# Patient Record
Sex: Female | Born: 1935 | Race: Black or African American | Hispanic: No | Smoking: Never smoker
Health system: Southern US, Community
[De-identification: ages and names within clinical notes are randomized; demographics above are authoritative.]

## PROBLEM LIST (undated history)

## (undated) DIAGNOSIS — K922 Gastrointestinal hemorrhage, unspecified: Secondary | ICD-10-CM

## (undated) DIAGNOSIS — E785 Hyperlipidemia, unspecified: Secondary | ICD-10-CM

## (undated) DIAGNOSIS — F0390 Unspecified dementia without behavioral disturbance: Secondary | ICD-10-CM

## (undated) DIAGNOSIS — R32 Unspecified urinary incontinence: Secondary | ICD-10-CM

## (undated) DIAGNOSIS — M549 Dorsalgia, unspecified: Secondary | ICD-10-CM

## (undated) DIAGNOSIS — H269 Unspecified cataract: Secondary | ICD-10-CM

## (undated) HISTORY — DX: Gastrointestinal hemorrhage, unspecified: K92.2

## (undated) HISTORY — PX: CATARACT EXTRACTION W/ INTRAOCULAR LENS  IMPLANT, BILATERAL: SHX1307

## (undated) HISTORY — DX: Dorsalgia, unspecified: M54.9

## (undated) HISTORY — DX: Unspecified cataract: H26.9

## (undated) HISTORY — PX: OTHER SURGICAL HISTORY: SHX169

## (undated) HISTORY — DX: Hyperlipidemia, unspecified: E78.5

## (undated) HISTORY — DX: Unspecified urinary incontinence: R32

---

## 2004-01-06 ENCOUNTER — Other Ambulatory Visit: Admission: RE | Admit: 2004-01-06 | Discharge: 2004-01-06 | Payer: Self-pay | Admitting: Family Medicine

## 2008-10-11 LAB — HM COLONOSCOPY

## 2008-10-11 LAB — HM DEXA SCAN

## 2009-02-21 ENCOUNTER — Encounter: Admission: RE | Admit: 2009-02-21 | Discharge: 2009-05-22 | Payer: Self-pay | Admitting: Family Medicine

## 2009-05-23 ENCOUNTER — Encounter: Admission: RE | Admit: 2009-05-23 | Discharge: 2009-08-10 | Payer: Self-pay | Admitting: Family Medicine

## 2010-06-20 ENCOUNTER — Ambulatory Visit (HOSPITAL_COMMUNITY): Admission: RE | Admit: 2010-06-20 | Discharge: 2010-06-20 | Payer: Self-pay | Admitting: Family Medicine

## 2010-06-20 LAB — HM MAMMOGRAPHY

## 2010-07-20 LAB — POC HEMOCCULT BLD/STL (HOME/3-CARD/SCREEN)

## 2010-08-23 LAB — HM PAP SMEAR

## 2010-12-02 ENCOUNTER — Encounter: Payer: Self-pay | Admitting: Family Medicine

## 2011-03-07 ENCOUNTER — Encounter: Payer: Self-pay | Admitting: Family Medicine

## 2011-03-07 DIAGNOSIS — R32 Unspecified urinary incontinence: Secondary | ICD-10-CM | POA: Insufficient documentation

## 2011-03-07 DIAGNOSIS — E785 Hyperlipidemia, unspecified: Secondary | ICD-10-CM | POA: Insufficient documentation

## 2011-03-07 DIAGNOSIS — H269 Unspecified cataract: Secondary | ICD-10-CM

## 2011-03-07 DIAGNOSIS — M549 Dorsalgia, unspecified: Secondary | ICD-10-CM | POA: Insufficient documentation

## 2011-03-07 DIAGNOSIS — K4090 Unilateral inguinal hernia, without obstruction or gangrene, not specified as recurrent: Secondary | ICD-10-CM | POA: Insufficient documentation

## 2011-03-07 DIAGNOSIS — K922 Gastrointestinal hemorrhage, unspecified: Secondary | ICD-10-CM

## 2011-05-06 ENCOUNTER — Other Ambulatory Visit: Payer: Self-pay | Admitting: Family Medicine

## 2011-05-06 DIAGNOSIS — M545 Low back pain: Secondary | ICD-10-CM

## 2011-05-08 ENCOUNTER — Ambulatory Visit
Admission: RE | Admit: 2011-05-08 | Discharge: 2011-05-08 | Disposition: A | Discharge status: Home / Self Care | Payer: Medicare Other | Source: Ambulatory Visit | Attending: Family Medicine | Admitting: Family Medicine

## 2011-05-08 DIAGNOSIS — M545 Low back pain: Secondary | ICD-10-CM

## 2011-12-11 DIAGNOSIS — T8529XA Other mechanical complication of intraocular lens, initial encounter: Secondary | ICD-10-CM | POA: Diagnosis not present

## 2012-02-03 DIAGNOSIS — R5381 Other malaise: Secondary | ICD-10-CM | POA: Diagnosis not present

## 2012-02-03 DIAGNOSIS — E785 Hyperlipidemia, unspecified: Secondary | ICD-10-CM | POA: Diagnosis not present

## 2012-02-03 DIAGNOSIS — R5383 Other fatigue: Secondary | ICD-10-CM | POA: Diagnosis not present

## 2012-02-03 DIAGNOSIS — E559 Vitamin D deficiency, unspecified: Secondary | ICD-10-CM | POA: Diagnosis not present

## 2012-02-27 DIAGNOSIS — M79609 Pain in unspecified limb: Secondary | ICD-10-CM | POA: Diagnosis not present

## 2012-02-27 DIAGNOSIS — B351 Tinea unguium: Secondary | ICD-10-CM | POA: Diagnosis not present

## 2012-02-28 DIAGNOSIS — W57XXXA Bitten or stung by nonvenomous insect and other nonvenomous arthropods, initial encounter: Secondary | ICD-10-CM | POA: Diagnosis not present

## 2012-02-28 DIAGNOSIS — L923 Foreign body granuloma of the skin and subcutaneous tissue: Secondary | ICD-10-CM | POA: Diagnosis not present

## 2012-03-10 DIAGNOSIS — R413 Other amnesia: Secondary | ICD-10-CM | POA: Diagnosis not present

## 2012-05-18 DIAGNOSIS — E785 Hyperlipidemia, unspecified: Secondary | ICD-10-CM | POA: Diagnosis not present

## 2012-05-18 DIAGNOSIS — R5383 Other fatigue: Secondary | ICD-10-CM | POA: Diagnosis not present

## 2012-05-18 DIAGNOSIS — R3919 Other difficulties with micturition: Secondary | ICD-10-CM | POA: Diagnosis not present

## 2012-05-18 DIAGNOSIS — K219 Gastro-esophageal reflux disease without esophagitis: Secondary | ICD-10-CM | POA: Diagnosis not present

## 2012-05-18 DIAGNOSIS — E559 Vitamin D deficiency, unspecified: Secondary | ICD-10-CM | POA: Diagnosis not present

## 2012-05-26 DIAGNOSIS — I739 Peripheral vascular disease, unspecified: Secondary | ICD-10-CM | POA: Diagnosis not present

## 2012-05-26 DIAGNOSIS — L851 Acquired keratosis [keratoderma] palmaris et plantaris: Secondary | ICD-10-CM | POA: Diagnosis not present

## 2012-08-25 DIAGNOSIS — L851 Acquired keratosis [keratoderma] palmaris et plantaris: Secondary | ICD-10-CM | POA: Diagnosis not present

## 2012-08-25 DIAGNOSIS — I70209 Unspecified atherosclerosis of native arteries of extremities, unspecified extremity: Secondary | ICD-10-CM | POA: Diagnosis not present

## 2012-08-25 DIAGNOSIS — L609 Nail disorder, unspecified: Secondary | ICD-10-CM | POA: Diagnosis not present

## 2012-08-26 DIAGNOSIS — K4090 Unilateral inguinal hernia, without obstruction or gangrene, not specified as recurrent: Secondary | ICD-10-CM | POA: Diagnosis not present

## 2012-08-26 DIAGNOSIS — E559 Vitamin D deficiency, unspecified: Secondary | ICD-10-CM | POA: Diagnosis not present

## 2012-08-26 DIAGNOSIS — E785 Hyperlipidemia, unspecified: Secondary | ICD-10-CM | POA: Diagnosis not present

## 2012-08-26 DIAGNOSIS — K922 Gastrointestinal hemorrhage, unspecified: Secondary | ICD-10-CM | POA: Diagnosis not present

## 2012-08-26 DIAGNOSIS — I10 Essential (primary) hypertension: Secondary | ICD-10-CM | POA: Diagnosis not present

## 2012-10-15 DIAGNOSIS — Z1231 Encounter for screening mammogram for malignant neoplasm of breast: Secondary | ICD-10-CM | POA: Diagnosis not present

## 2012-11-17 DIAGNOSIS — I70209 Unspecified atherosclerosis of native arteries of extremities, unspecified extremity: Secondary | ICD-10-CM | POA: Diagnosis not present

## 2012-11-17 DIAGNOSIS — L851 Acquired keratosis [keratoderma] palmaris et plantaris: Secondary | ICD-10-CM | POA: Diagnosis not present

## 2012-11-17 DIAGNOSIS — L609 Nail disorder, unspecified: Secondary | ICD-10-CM | POA: Diagnosis not present

## 2012-12-03 DIAGNOSIS — H52229 Regular astigmatism, unspecified eye: Secondary | ICD-10-CM | POA: Diagnosis not present

## 2012-12-03 DIAGNOSIS — Z961 Presence of intraocular lens: Secondary | ICD-10-CM | POA: Diagnosis not present

## 2012-12-03 DIAGNOSIS — H524 Presbyopia: Secondary | ICD-10-CM | POA: Diagnosis not present

## 2012-12-29 DIAGNOSIS — M25579 Pain in unspecified ankle and joints of unspecified foot: Secondary | ICD-10-CM | POA: Diagnosis not present

## 2012-12-29 DIAGNOSIS — I10 Essential (primary) hypertension: Secondary | ICD-10-CM | POA: Diagnosis not present

## 2012-12-29 DIAGNOSIS — K219 Gastro-esophageal reflux disease without esophagitis: Secondary | ICD-10-CM | POA: Diagnosis not present

## 2012-12-29 DIAGNOSIS — E559 Vitamin D deficiency, unspecified: Secondary | ICD-10-CM | POA: Diagnosis not present

## 2012-12-29 DIAGNOSIS — N318 Other neuromuscular dysfunction of bladder: Secondary | ICD-10-CM | POA: Diagnosis not present

## 2012-12-29 DIAGNOSIS — E785 Hyperlipidemia, unspecified: Secondary | ICD-10-CM | POA: Diagnosis not present

## 2013-01-06 DIAGNOSIS — K4090 Unilateral inguinal hernia, without obstruction or gangrene, not specified as recurrent: Secondary | ICD-10-CM | POA: Diagnosis not present

## 2013-02-17 ENCOUNTER — Ambulatory Visit: Payer: Self-pay | Admitting: Family Medicine

## 2013-03-23 DIAGNOSIS — R413 Other amnesia: Secondary | ICD-10-CM | POA: Diagnosis not present

## 2013-03-23 DIAGNOSIS — F0280 Dementia in other diseases classified elsewhere without behavioral disturbance: Secondary | ICD-10-CM | POA: Diagnosis not present

## 2013-03-31 ENCOUNTER — Ambulatory Visit: Payer: Medicare Other | Admitting: Family Medicine

## 2013-04-07 ENCOUNTER — Other Ambulatory Visit: Payer: Self-pay | Admitting: Family Medicine

## 2013-04-08 NOTE — Telephone Encounter (Signed)
RXs called into [facility] with 6 refills

## 2013-04-08 NOTE — Telephone Encounter (Signed)
Please advise patient calling back on this

## 2013-04-08 NOTE — Telephone Encounter (Signed)
It is okay to refill both of these prescriptions for 6 month

## 2013-04-15 IMAGING — CR DG ANKLE COMPLETE 3+V*R*
3 series · 3 of 3 positions shown · non-contrast
Comparison: None.

CLINICAL DATA: Medial ankle pain. No history of injury.

RIGHT ANKLE - COMPLETE 3+ VIEW

[view not recorded (1 of 3)]
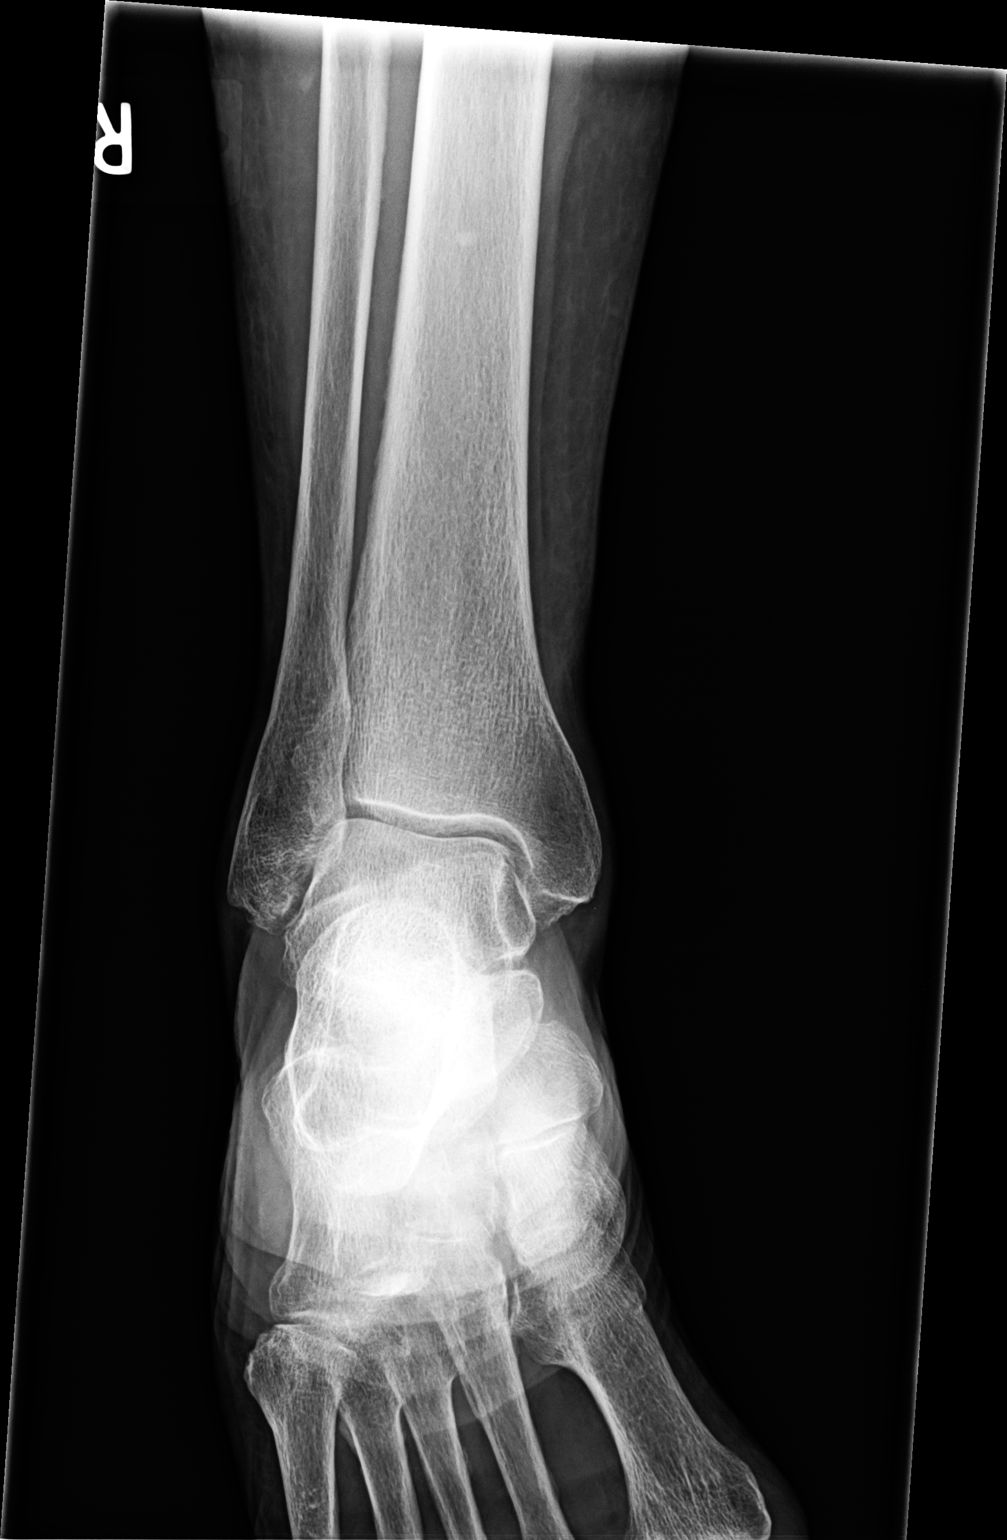

[view not recorded (2 of 3)]
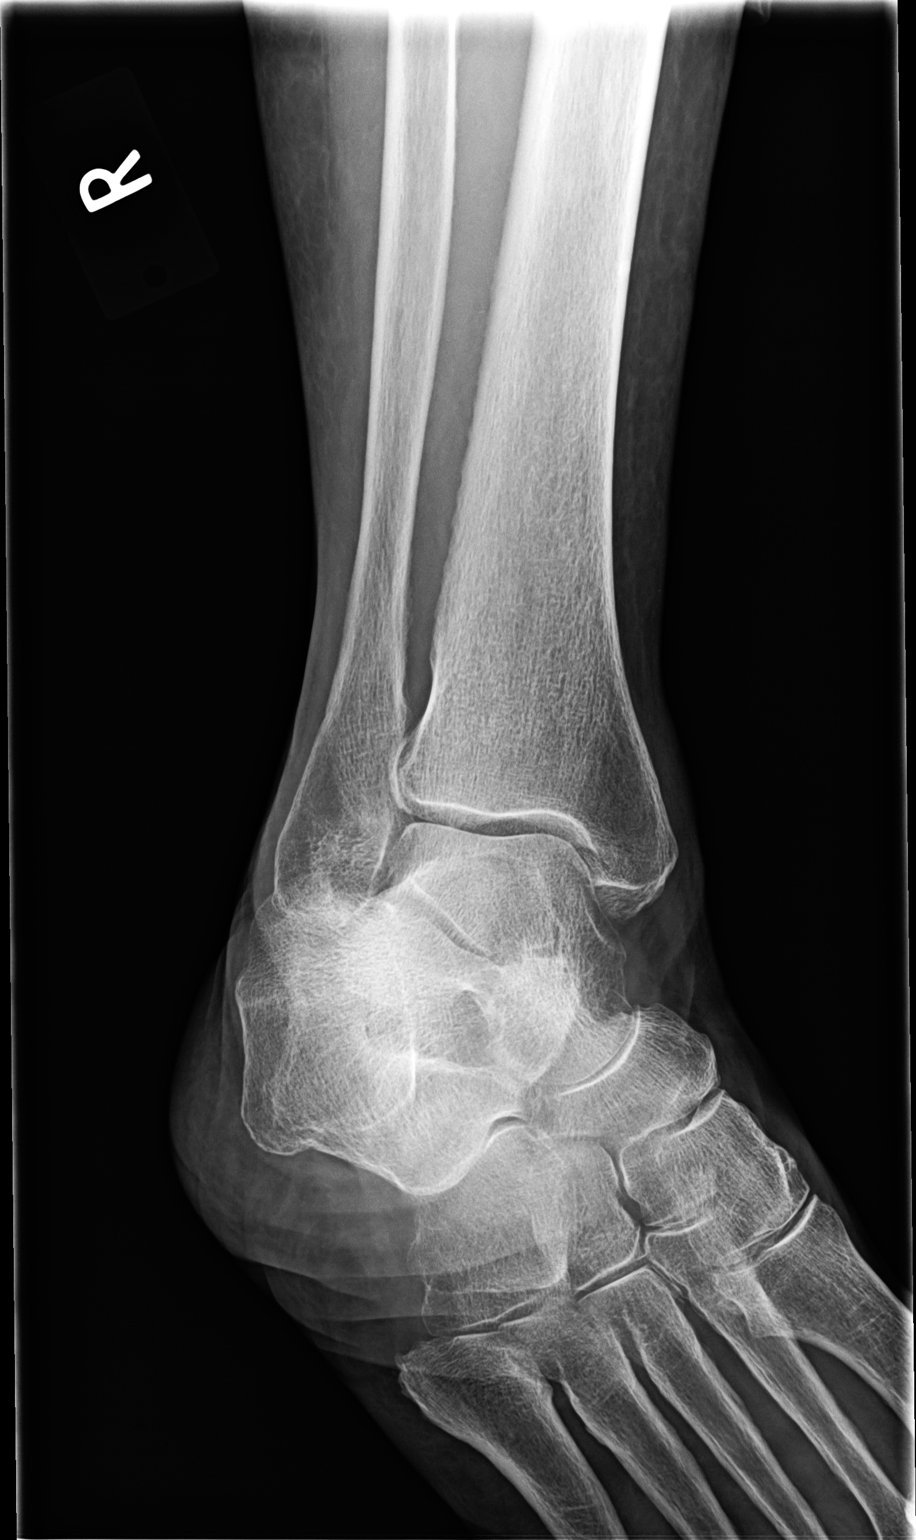

[view not recorded (3 of 3)]
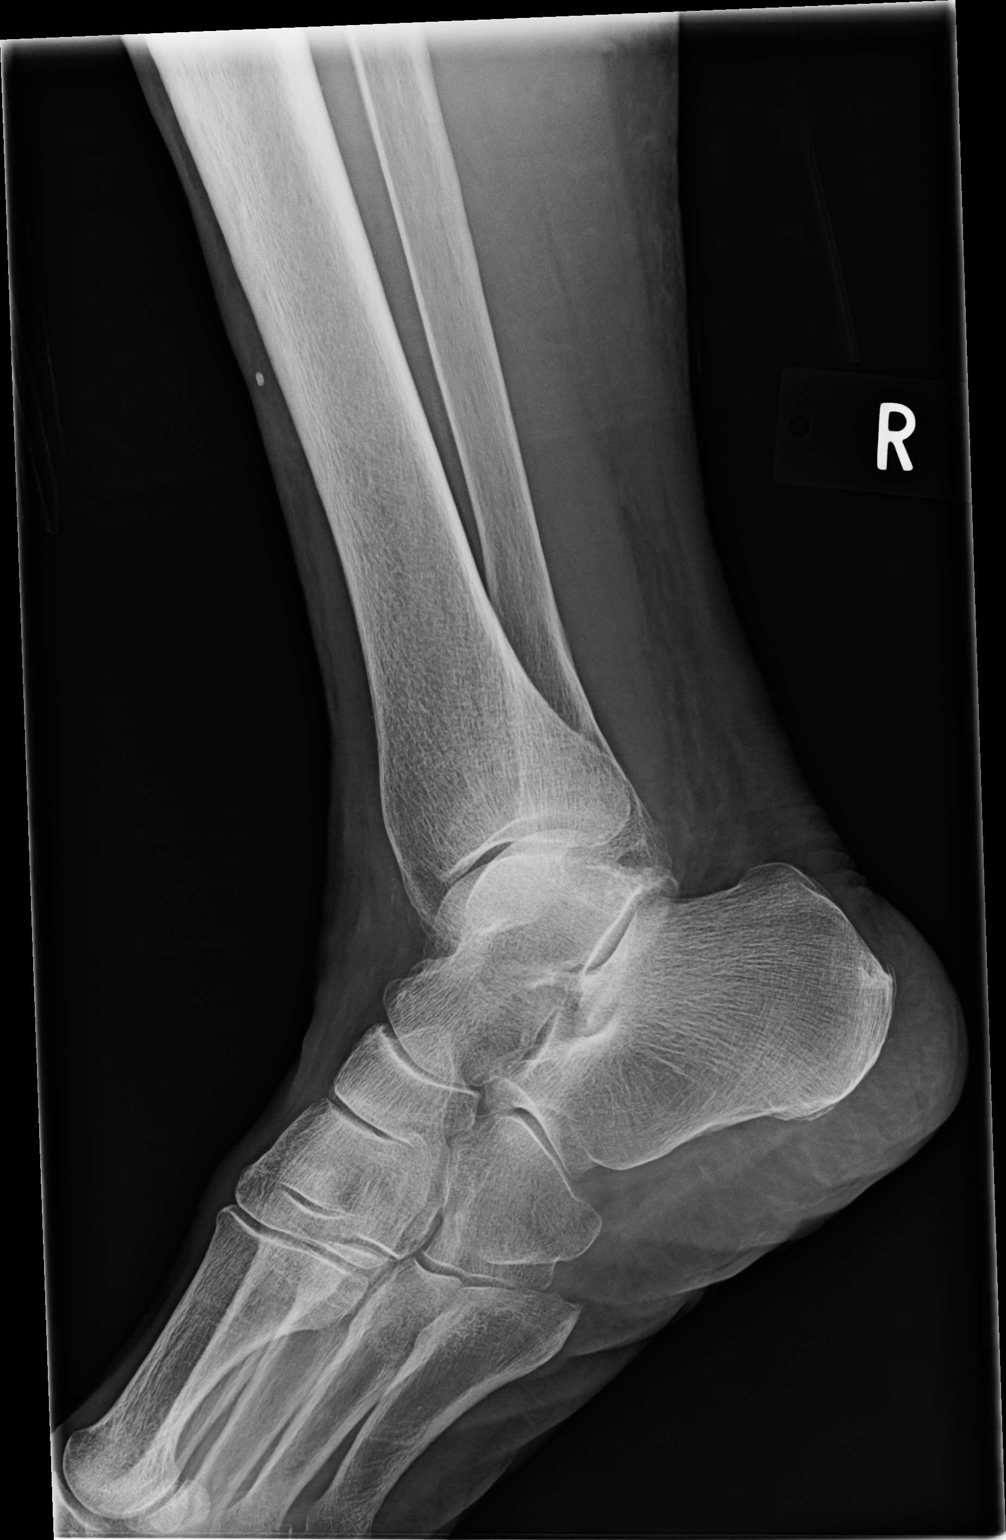

[3 of 3 positions shown; findings below may reference images not displayed]

FINDINGS: Imaged bones, joints and soft tissues appear normal.
IMPRESSION: Negative exam.

## 2013-05-13 ENCOUNTER — Other Ambulatory Visit: Payer: Self-pay | Admitting: Family Medicine

## 2013-07-02 ENCOUNTER — Other Ambulatory Visit: Payer: Self-pay | Admitting: Family Medicine

## 2013-07-02 NOTE — Telephone Encounter (Signed)
Last seen 12/29/12  DWM

## 2013-07-06 ENCOUNTER — Other Ambulatory Visit: Payer: Self-pay | Admitting: Family Medicine

## 2013-08-04 ENCOUNTER — Encounter: Payer: Self-pay | Admitting: Family Medicine

## 2013-08-04 ENCOUNTER — Ambulatory Visit (INDEPENDENT_AMBULATORY_CARE_PROVIDER_SITE_OTHER): Payer: Medicare Other | Admitting: Family Medicine

## 2013-08-04 ENCOUNTER — Ambulatory Visit (INDEPENDENT_AMBULATORY_CARE_PROVIDER_SITE_OTHER): Payer: Medicare Other

## 2013-08-04 VITALS — BP 132/75 | HR 62 | Temp 97.7°F | Ht 61.0 in | Wt 132.0 lb

## 2013-08-04 DIAGNOSIS — Z0000 Encounter for general adult medical examination without abnormal findings: Secondary | ICD-10-CM

## 2013-08-04 DIAGNOSIS — R350 Frequency of micturition: Secondary | ICD-10-CM | POA: Diagnosis not present

## 2013-08-04 DIAGNOSIS — E559 Vitamin D deficiency, unspecified: Secondary | ICD-10-CM | POA: Diagnosis not present

## 2013-08-04 DIAGNOSIS — K4090 Unilateral inguinal hernia, without obstruction or gangrene, not specified as recurrent: Secondary | ICD-10-CM

## 2013-08-04 DIAGNOSIS — E785 Hyperlipidemia, unspecified: Secondary | ICD-10-CM

## 2013-08-04 DIAGNOSIS — Z23 Encounter for immunization: Secondary | ICD-10-CM | POA: Diagnosis not present

## 2013-08-04 DIAGNOSIS — M549 Dorsalgia, unspecified: Secondary | ICD-10-CM

## 2013-08-04 LAB — POCT URINALYSIS DIPSTICK
Bilirubin, UA: NEGATIVE
Glucose, UA: NEGATIVE
Ketones, UA: NEGATIVE
Nitrite, UA: NEGATIVE
pH, UA: 7

## 2013-08-04 LAB — POCT UA - MICROSCOPIC ONLY
Bacteria, U Microscopic: NEGATIVE
Yeast, UA: NEGATIVE

## 2013-08-04 LAB — POCT CBC
MCV: 94 fL (ref 80–97)
MPV: 7.3 fL (ref 0–99.8)
POC Granulocyte: 2.4 (ref 2–6.9)
RBC: 4.9 M/uL (ref 4.04–5.48)
RDW, POC: 14.2 %

## 2013-08-04 NOTE — Progress Notes (Signed)
Subjective:    Patient ID: Courtney Frank, female    DOB: 11/24/1914, 79 y.o.   MRN: [id number]  HPIpt here today for annual physical exam and follow up and management of chronic medical problems. He is concerned that her inguinal hernia maybe getting bigger.: Health maintenance parameters she will be given FOBT today. She'll get a chest x-ray EKG flu shot and Prevnar today. Labs were also be drawn today and she will be scheduled for a mammogram in January of 15.    Patient Active Problem List   Diagnosis Date Noted  . GI bleed   . Other and unspecified hyperlipidemia   . Incontinence   . Back pain   . Cataracts, bilateral   . Inguinal hernia    Outpatient Encounter Prescriptions as of 08/04/2013  Medication Sig Dispense Refill  . acetaminophen (TYLENOL) 325 MG tablet Take 650 mg by mouth every 6 (six) hours as needed.        . calcium carbonate (OS-CAL) 600 MG TABS Take 600 mg by mouth 2 (two) times daily with a meal.        . Cholecalciferol (VITAMIN D3) 1000 UNITS CAPS Take 1 capsule by mouth daily.        Marland Kitchen EVISTA 60 MG tablet TAKE ONE TABLET BY MOUTH EVERY DAY  30 tablet  5  . lidocaine (LIDODERM) 5 % Place 1 patch onto the skin daily. Remove & Discard patch within 12 hours or as directed by MD       . omeprazole (PRILOSEC) 40 MG capsule TAKE ONE CAPSULE BY MOUTH IN THE MORNING ONE  HOUR  BEFORE  MEAL  30 capsule  0  . VYTORIN 10-80 MG per tablet TAKE ONE TABLET BY MOUTH EVERY DAY  30 tablet  5   No facility-administered encounter medications on file as of 08/04/2013.       Review of Systems  Constitutional: Negative.   HENT: Negative.   Eyes: Negative.   Respiratory: Negative.   Cardiovascular: Negative.   Gastrointestinal: Positive for abdominal pain (low abdominal aches).  Endocrine: Negative.   Genitourinary: Positive for frequency (worse at night).  Musculoskeletal: Positive for back pain (low back aches).  Skin: Negative.   Allergic/Immunologic: Negative.    Neurological: Positive for dizziness.  Hematological: Negative.   Psychiatric/Behavioral: Negative.        Objective:   Physical Exam  Nursing note and vitals reviewed. Constitutional: She is oriented to person, place, and time. She appears well-developed and well-nourished. No distress.   For her age of 73  HENT:  Head: Normocephalic and atraumatic.  Right Ear: External ear normal.  Left Ear: External ear normal.  Nose: Nose normal.  Mouth/Throat: No oropharyngeal exudate.  Eyes: Conjunctivae and EOM are normal. Pupils are equal, round, and reactive to light. Right eye exhibits no discharge. Left eye exhibits no discharge. No scleral icterus.  Neck: Normal range of motion. Neck supple. No thyromegaly present.  Cardiovascular: Normal rate, regular rhythm, normal heart sounds and intact distal pulses.  Exam reveals no gallop and no friction rub.   No murmur heard. 72 per minute Intact femoral pulses  Pulmonary/Chest: Effort normal and breath sounds normal. No respiratory distress. She has no wheezes. She has no rales. She exhibits no tenderness.  Abdominal: Soft. Bowel sounds are normal. She exhibits no mass. There is no tenderness. There is no rebound.  Musculoskeletal: Normal range of motion. She exhibits no edema and no tenderness.  Lymphadenopathy:    She has  no cervical adenopathy.  Neurological: She is alert and oriented to person, place, and time. She has normal reflexes. No cranial nerve deficit.  Patient is concerned about her memory but in my opinion her memory is excellent she is understanding of all components of the conversation with her and questions medical he relevant things that are completely in touch with whom she is  Skin: Skin is warm and dry.  Psychiatric: She has a normal mood and affect. Her behavior is normal. Judgment and thought content normal.    BP 132/75  Pulse 62  Temp(Src) 97.7 F (36.5 C) (Oral)  Ht 5\' 1"  (1.549 m)  Wt 132 lb (59.875 kg)  BMI  24.95 kg/m2  [facility] reading (PRIMARY) by  Dr. Christell Constant: Chest x-ray----degenerative changes heart and lungs appear to be normal EKG; sinus bradycardia at 57, otherwise no change from previous EKGs                                     Assessment & Plan:   1. Annual physical exam   2. Hyperlipidemia   3. Back pain, secondary to osteoarthritis and degenerative disc disease   4. Inguinal hernia left side   5. Need for prophylactic vaccination against Streptococcus pneumoniae (pneumococcus)   6. Urine frequency    Orders Placed This Encounter  Procedures  . DG Chest 2 View    Standing Status: Future     Number of Occurrences:      Standing Expiration Date: 10/04/2014    Order Specific Question:  Reason for Exam (SYMPTOM  OR DIAGNOSIS REQUIRED)    Answer:  annual physical    Order Specific Question:  Preferred imaging location?    Answer:  Internal  . Pneumococcal conjugate vaccine 13-valent less than 5yo IM  . Hepatic function panel  . BMP8+EGFR  . NMR, lipoprofile  . Vit D  25 hydroxy (rtn osteoporosis monitoring)  . POCT CBC  . POCT urinalysis dipstick  . POCT UA - Microscopic Only  . EKG 12-Lead   No orders of the defined types were placed in this encounter.   Patient Instructions  Please be careful and did not put yourself at risk for falling Flu shot today, and Prevnar Continue current treatment and medications Continue aggressive therapeutic lifestyle changes Continue plenty of fluids Can not get your eye exam Try again the Lidoderm patches for your back, you may leave these on for 12 hours, then remove them Call her with the lab work once it is available including the urinalysis. Bring back FOBT If any increasing abdominal pain in the area of the left inguinal hernia, please get in touch with [place] or the emergency room immediately    Nyra Capes MD

## 2013-08-04 NOTE — Patient Instructions (Addendum)
Please be careful and did not put yourself at risk for falling Flu shot today, and Prevnar Continue current treatment and medications Continue aggressive therapeutic lifestyle changes Continue plenty of fluids Can not get your eye exam Try again the Lidoderm patches for your back, you may leave these on for 12 hours, then remove them Call her with the lab work once it is available including the urinalysis. Bring back FOBT If any increasing abdominal pain in the area of the left inguinal hernia, please get in touch with [place] or the emergency room immediately

## 2013-08-06 LAB — BMP8+EGFR
BUN/Creatinine Ratio: 19 (ref 11–26)
GFR calc Af Amer: 63 mL/min/{1.73_m2} (ref >59)
GFR calc non Af Amer: 55 mL/min/{1.73_m2} — ABNORMAL LOW (ref >59)

## 2013-08-06 LAB — NMR, LIPOPROFILE
Cholesterol: 167 mg/dL (ref <200)
HDL Cholesterol by NMR: 58 mg/dL (ref >=40)
LDL Size: 20.8 nm (ref >20.5)
LP-IR Score: 57 — ABNORMAL HIGH (ref <=45)
Triglycerides by NMR: 117 mg/dL (ref <150)

## 2013-08-06 LAB — HEPATIC FUNCTION PANEL
Alkaline Phosphatase: 87 IU/L (ref 39–117)
Bilirubin, Direct: 0.11 mg/dL (ref 0.00–0.40)
Total Bilirubin: 0.4 mg/dL (ref 0.0–1.2)
Total Protein: 6.1 g/dL (ref 6.0–8.5)

## 2013-08-06 LAB — VITAMIN D 25 HYDROXY (VIT D DEFICIENCY, FRACTURES): Vit D, 25-Hydroxy: 35.3 ng/mL (ref 30.0–100.0)

## 2013-08-19 ENCOUNTER — Other Ambulatory Visit: Payer: Self-pay | Admitting: Family Medicine

## 2013-08-23 ENCOUNTER — Telehealth: Payer: Self-pay | Admitting: Family Medicine

## 2013-08-23 NOTE — Telephone Encounter (Signed)
Medication Prescriptions/Oxybutynn 10 mg.

## 2013-08-27 MED ORDER — OXYBUTYNIN CHLORIDE ER 10 MG PO TB24: 10.0000 mg | ORAL_TABLET | Freq: Every day | ORAL | Status: DC

## 2013-08-27 NOTE — Telephone Encounter (Signed)
Pt wants this med called in to pharm - not in med list. Pt stated she has been on it for awhile- rf'd at [facility]

## 2013-11-16 ENCOUNTER — Encounter (INDEPENDENT_AMBULATORY_CARE_PROVIDER_SITE_OTHER): Payer: Self-pay

## 2013-11-16 ENCOUNTER — Encounter: Payer: Self-pay | Admitting: Family Medicine

## 2013-11-16 ENCOUNTER — Ambulatory Visit (INDEPENDENT_AMBULATORY_CARE_PROVIDER_SITE_OTHER): Payer: Medicare Other | Admitting: Family Medicine

## 2013-11-16 VITALS — BP 133/81 | HR 66 | Temp 97.6°F | Ht 61.0 in | Wt 130.0 lb

## 2013-11-16 DIAGNOSIS — G4700 Insomnia, unspecified: Secondary | ICD-10-CM | POA: Diagnosis not present

## 2013-11-16 DIAGNOSIS — Z23 Encounter for immunization: Secondary | ICD-10-CM | POA: Diagnosis not present

## 2013-11-16 DIAGNOSIS — E559 Vitamin D deficiency, unspecified: Secondary | ICD-10-CM

## 2013-11-16 DIAGNOSIS — G4762 Sleep related leg cramps: Secondary | ICD-10-CM | POA: Insufficient documentation

## 2013-11-16 DIAGNOSIS — E785 Hyperlipidemia, unspecified: Secondary | ICD-10-CM

## 2013-11-16 LAB — POCT CBC
Granulocyte percent: 63.6 %G (ref 37–80)
HCT, POC: 47.2 % (ref 37.7–47.9)
HEMOGLOBIN: 14.2 g/dL (ref 12.2–16.2)
Lymph, poc: 0.9 (ref 0.6–3.4)
MCH: 28.9 pg (ref 27–31.2)
MCHC: 30.2 g/dL — AB (ref 31.8–35.4)
MCV: 95.7 fL (ref 80–97)
MPV: 7.3 fL (ref 0–99.8)
PLATELET COUNT, POC: 182 10*3/uL (ref 142–424)
POC GRANULOCYTE: 2 (ref 2–6.9)
POC LYMPH %: 30.3 % (ref 10–50)
RBC: 4.9 M/uL (ref 4.04–5.48)
RDW, POC: 14.9 %
WBC: 3.1 10*3/uL — AB (ref 4.6–10.2)

## 2013-11-16 NOTE — Patient Instructions (Addendum)
Continue current medications. Continue good therapeutic lifestyle changes which include good diet and exercise. Fall precautions discussed with patient. Schedule your flu vaccine if you haven't had it yet If you are over 78 years old - you may need Prevnar 13 or the adult Pneumonia vaccine. Continue to drink plenty of fluids The Prevnar vaccine that you received today and may make your arm sore. Please return the FOBT We will call you with the lab work once the results are available You can purchase Centrum silver and I-Caps OVER-the-counter, take one of each of these daily Support hose can also be helpful by putting him on early in the morning upon first arising from the bed Using some saline nasal spray may help your sinuses and congestion especially during the winter, the saline spray can be purchased over-the-counter Use a cool mist humidifier in her bedroom at night

## 2013-11-16 NOTE — Progress Notes (Signed)
Subjective:    Patient ID: Courtney Frank, female    DOB: [birth year], 80 y.o.   MRN: [id number]  HPI Pt here for follow up and management of chronic medical problems. Patient complains of getting tired easily and leg cramps at nighttime. Her complaint is insomnia and nighttime leg cramps. She denies any excessive caffeine intake other than in the morning. She is due to get her Prevnar vaccine today.        Patient Active Problem List   Diagnosis Date Noted  . Urine frequency 08/04/2013  . Need for prophylactic vaccination against Streptococcus pneumoniae (pneumococcus) 08/04/2013  . GI bleed, gastritis   . Hyperlipidemia   . Incontinence   . Back pain   . Cataracts, bilateral   . Inguinal hernia    Outpatient Encounter Prescriptions as of 11/16/2013  Medication Sig  . acetaminophen (TYLENOL) 325 MG tablet Take 650 mg by mouth every 6 (six) hours as needed.    . calcium carbonate (OS-CAL) 600 MG TABS Take 600 mg by mouth 2 (two) times daily with a meal.    . Cholecalciferol (VITAMIN D3) 1000 UNITS CAPS Take 1 capsule by mouth daily.    Marland Kitchen EVISTA 60 MG tablet TAKE ONE TABLET BY MOUTH EVERY DAY  . lidocaine (LIDODERM) 5 % Place 1 patch onto the skin daily. Remove & Discard patch within 12 hours or as directed by MD   . omeprazole (PRILOSEC) 40 MG capsule TAKE ONE CAPSULE BY MOUTH IN THE MORNING ONE  HOUR  BEFORE  MEAL  . oxybutynin (DITROPAN-XL) 10 MG 24 hr tablet Take 1 tablet (10 mg total) by mouth daily.  Marland Kitchen VYTORIN 10-80 MG per tablet TAKE ONE TABLET BY MOUTH EVERY DAY    Review of Systems  Constitutional: Positive for fatigue (gets tired easily).  HENT: Negative.   Eyes: Negative.   Respiratory: Negative.   Cardiovascular: Negative.   Gastrointestinal: Negative.   Endocrine: Negative.   Genitourinary: Negative.   Musculoskeletal: Negative.        Cramps in feet and legs at night  Skin: Negative.   Allergic/Immunologic: Negative.   Neurological: Negative.     Hematological: Negative.   Psychiatric/Behavioral: Negative.        Objective:   Physical Exam  Nursing note and vitals reviewed. Constitutional: She is oriented to person, place, and time. She appears well-developed and well-nourished. No distress.  Very alert and very young appearing for her stated age of 37 years  HENT:  Head: Normocephalic and atraumatic.  Right Ear: External ear normal.  Left Ear: External ear normal.  Mouth/Throat: Oropharynx is clear and moist.  Nasal congestion right nares  Eyes: Conjunctivae and EOM are normal. Pupils are equal, round, and reactive to light. Right eye exhibits no discharge. Left eye exhibits no discharge. No scleral icterus.  Neck: Normal range of motion. Neck supple. No JVD present. No thyromegaly present.  No carotid bruits  Cardiovascular: Normal rate, regular rhythm, normal heart sounds and intact distal pulses.  Exam reveals no gallop and no friction rub.   No murmur heard. At 60 per minute  Pulmonary/Chest: Effort normal and breath sounds normal. No respiratory distress. She has no wheezes. She has no rales. She exhibits no tenderness.  Abdominal: Soft. Bowel sounds are normal. She exhibits no mass. There is no tenderness. There is no rebound and no guarding.  Musculoskeletal: Normal range of motion. She exhibits no edema and no tenderness.  Lymphadenopathy:    She has no cervical  adenopathy.  Neurological: She is alert and oriented to person, place, and time. She has normal reflexes. No cranial nerve deficit.  Skin: Skin is warm and dry.  Psychiatric: She has a normal mood and affect. Her behavior is normal. Judgment and thought content normal.   BP 133/81  Pulse 66  Temp(Src) 97.6 F (36.4 C) (Oral)  Ht 5' 1"  (1.549 m)  Wt 130 lb (58.968 kg)  BMI 24.58 kg/m2        Assessment & Plan:  1. Hyperlipidemia - POCT CBC - BMP8+EGFR - Hepatic function panel - NMR, lipoprofile  2. Vitamin D deficiency - Vit D  25 hydroxy  (rtn osteoporosis monitoring)  3. Nocturnal leg cramps  4. Insomnia  Patient Instructions  Continue current medications. Continue good therapeutic lifestyle changes which include good diet and exercise. Fall precautions discussed with patient. Schedule your flu vaccine if you haven't had it yet If you are over 75 years old - you may need Prevnar 50 or the adult Pneumonia vaccine. Continue to drink plenty of fluids The Prevnar vaccine that you received today and may make your arm sore. Please return the FOBT We will call you with the lab work once the results are available You can purchase Centrum silver and I-Caps OVER-the-counter, take one of each of these daily Support hose can also be helpful by putting him on early in the morning upon first arising from the bed Using some saline nasal spray may help your sinuses and congestion especially during the winter, the saline spray can be purchased over-the-counter Use a cool mist humidifier in her bedroom at night   Arrie Senate MD

## 2013-11-17 LAB — BMP8+EGFR
BUN/Creatinine Ratio: 17 (ref 11–26)
BUN: 14 mg/dL (ref 10–36)
CHLORIDE: 102 mmol/L (ref 97–108)
CO2: 24 mmol/L (ref 18–29)
Calcium: 9.7 mg/dL (ref 8.6–10.2)
Creatinine, Ser: 0.84 mg/dL (ref 0.57–1.00)
GFR calc Af Amer: 68 mL/min/{1.73_m2} (ref >59)
GFR, EST NON AFRICAN AMERICAN: 59 mL/min/{1.73_m2} — AB (ref >59)
GLUCOSE: 91 mg/dL (ref 65–99)
POTASSIUM: 5 mmol/L (ref 3.5–5.2)
SODIUM: 143 mmol/L (ref 134–144)

## 2013-11-17 LAB — NMR, LIPOPROFILE
CHOLESTEROL: 190 mg/dL (ref <200)
HDL CHOLESTEROL BY NMR: 51 mg/dL (ref >=40)
HDL Particle Number: 37.7 umol/L (ref >=30.5)
LDL Particle Number: 1787 nmol/L — ABNORMAL HIGH (ref <1000)
LDL Size: 20.7 nm (ref >20.5)
LDLC SERPL CALC-MCNC: 111 mg/dL — ABNORMAL HIGH (ref <100)
LP-IR Score: 65 — ABNORMAL HIGH (ref <=45)
Small LDL Particle Number: 933 nmol/L — ABNORMAL HIGH (ref <=527)
TRIGLYCERIDES BY NMR: 139 mg/dL (ref <150)

## 2013-11-17 LAB — HEPATIC FUNCTION PANEL
ALK PHOS: 79 IU/L (ref 39–117)
ALT: 10 IU/L (ref 0–32)
AST: 16 IU/L (ref 0–40)
Albumin: 3.8 g/dL (ref 3.2–4.6)
BILIRUBIN TOTAL: 0.3 mg/dL (ref 0.0–1.2)
Bilirubin, Direct: 0.1 mg/dL (ref 0.00–0.40)
TOTAL PROTEIN: 6.1 g/dL (ref 6.0–8.5)

## 2013-11-17 LAB — VITAMIN D 25 HYDROXY (VIT D DEFICIENCY, FRACTURES): Vit D, 25-Hydroxy: 37 ng/mL (ref 30.0–100.0)

## 2013-11-17 NOTE — Addendum Note (Signed)
Addended by: Magdalene RiverBULLINS, JAMIE H on: 11/17/2013 04:42 PM   Modules accepted: Orders

## 2013-11-29 ENCOUNTER — Telehealth: Payer: Self-pay | Admitting: Family Medicine

## 2013-12-01 NOTE — Telephone Encounter (Signed)
Can you type this for dr. Christell ConstantMoore to sign please.

## 2013-12-01 NOTE — Telephone Encounter (Signed)
I believe that Courtney Frank is competent and is of sound mind to make decisions about her well being and her estate Please rout this to be typed on our letterhead and I will sign

## 2013-12-02 ENCOUNTER — Encounter: Payer: Self-pay | Admitting: Family Medicine

## 2013-12-02 NOTE — Telephone Encounter (Signed)
Letter has been typed and given to Dr. Christell ConstantMoore.

## 2013-12-27 ENCOUNTER — Other Ambulatory Visit: Payer: Self-pay | Admitting: Family Medicine

## 2014-01-11 DIAGNOSIS — N3941 Urge incontinence: Secondary | ICD-10-CM | POA: Diagnosis not present

## 2014-01-11 DIAGNOSIS — F0390 Unspecified dementia without behavioral disturbance: Secondary | ICD-10-CM | POA: Diagnosis not present

## 2014-01-20 ENCOUNTER — Telehealth: Payer: Self-pay | Admitting: Family Medicine

## 2014-01-20 NOTE — Telephone Encounter (Signed)
Patient said the memory center in [place] gave her a new medication and she doesn't feel well since taking. I advised care giver to call them and let them know and scheduled her a appointment for Monday with Courtney Frank

## 2014-01-24 ENCOUNTER — Ambulatory Visit: Payer: Medicare Other | Admitting: Family Medicine

## 2014-02-17 ENCOUNTER — Ambulatory Visit: Payer: Medicare Other | Admitting: Family Medicine

## 2014-02-18 ENCOUNTER — Other Ambulatory Visit: Payer: Self-pay | Admitting: Family Medicine

## 2014-03-03 DIAGNOSIS — Z0289 Encounter for other administrative examinations: Secondary | ICD-10-CM

## 2014-03-09 ENCOUNTER — Encounter: Payer: Self-pay | Admitting: Family Medicine

## 2014-03-09 ENCOUNTER — Ambulatory Visit (INDEPENDENT_AMBULATORY_CARE_PROVIDER_SITE_OTHER): Payer: Medicare Other | Admitting: Family Medicine

## 2014-03-09 VITALS — BP 139/72 | HR 68 | Temp 97.6°F | Ht 61.0 in | Wt 127.0 lb

## 2014-03-09 DIAGNOSIS — E785 Hyperlipidemia, unspecified: Secondary | ICD-10-CM | POA: Diagnosis not present

## 2014-03-09 DIAGNOSIS — R413 Other amnesia: Secondary | ICD-10-CM

## 2014-03-09 DIAGNOSIS — M810 Age-related osteoporosis without current pathological fracture: Secondary | ICD-10-CM | POA: Diagnosis not present

## 2014-03-09 DIAGNOSIS — R252 Cramp and spasm: Secondary | ICD-10-CM

## 2014-03-09 DIAGNOSIS — E559 Vitamin D deficiency, unspecified: Secondary | ICD-10-CM | POA: Diagnosis not present

## 2014-03-09 LAB — POCT CBC
Granulocyte percent: 57.7 %G (ref 37–80)
HCT, POC: 45.4 % (ref 37.7–47.9)
Hemoglobin: 14.4 g/dL (ref 12.2–16.2)
Lymph, poc: 1 (ref 0.6–3.4)
MCH: 30.5 pg (ref 27–31.2)
MCHC: 31.7 g/dL — AB (ref 31.8–35.4)
MCV: 96.5 fL (ref 80–97)
MPV: 7.8 fL (ref 0–99.8)
POC Granulocyte: 1.7 — AB (ref 2–6.9)
POC LYMPH PERCENT: 34.4 %L (ref 10–50)
Platelet Count, POC: 182 10*3/uL (ref 142–424)
RBC: 4.7 M/uL (ref 4.04–5.48)
RDW, POC: 14.8 %
WBC: 3 10*3/uL — AB (ref 4.6–10.2)

## 2014-03-09 NOTE — Patient Instructions (Addendum)
Medicare Annual Wellness Visit  [place] and the medical providers at [facility] strive to bring you the best medical care.  In doing so we not only want to address your current medical conditions and concerns but also to detect new conditions early and prevent illness, disease and health-related problems.    Medicare offers a yearly Wellness Visit which allows our clinical staff to assess your need for preventative services including immunizations, lifestyle education, counseling to decrease risk of preventable diseases and screening for fall risk and other medical concerns.    This visit is provided free of charge (no copay) for all Medicare recipients. The clinical pharmacists at [facility] have begun to conduct these Wellness Visits which will also include a thorough review of all your medications.    As you primary medical provider recommend that you make an appointment for your Annual Wellness Visit if you have not done so already this year.  You may set up this appointment before you leave today or you may call back [phone] and schedule an appointment.  Please make sure when you call that you mention that you are scheduling your Annual Wellness Visit with the clinical pharmacist so that the appointment may be made for the proper length of time.      Continue current medications. Continue good therapeutic lifestyle changes which include good diet and exercise. Fall precautions discussed with patient. If an FOBT was given today- please return it to our front desk. If you are over 78 years old - you may need Prevnar 13 or the adult Pneumonia vaccine.  HOLD VYTORIN FOR 3-4 MOS- UNTIL NEXT VISIT!

## 2014-03-09 NOTE — Progress Notes (Signed)
Subjective:    Patient ID: Courtney Frank, female    DOB: Mar 12, 1915, 80 y.o.   MRN: [id number]  HPI Pt here for follow up and management of chronic medical problems. The patient has no particular complaints. She was recently given a handicap form for her car. She is 80 years old but very alert and mentally with it for her age and appears younger than her stated age. The patient was seen at the memory clinic at [facility] and was started on Aricept. She did not tolerate this well and she discontinued it.       Patient Active Problem List   Diagnosis Date Noted  . Vitamin D deficiency 11/16/2013  . Nocturnal leg cramps 11/16/2013  . Insomnia 11/16/2013  . Urine frequency 08/04/2013  . Need for prophylactic vaccination against Streptococcus pneumoniae (pneumococcus) 08/04/2013  . History of GI bleed   . Hyperlipidemia   . Incontinence   . Back pain   . Cataracts, bilateral   . Inguinal hernia    Outpatient Encounter Prescriptions as of 03/09/2014  Medication Sig  . acetaminophen (TYLENOL) 325 MG tablet Take 650 mg by mouth every 6 (six) hours as needed.    . calcium carbonate (OS-CAL) 600 MG TABS Take 600 mg by mouth 2 (two) times daily with a meal.    . Cholecalciferol (VITAMIN D3) 1000 UNITS CAPS Take 1 capsule by mouth daily.    Marland Kitchen EVISTA 60 MG tablet TAKE ONE TABLET BY MOUTH EVERY DAY  . lidocaine (LIDODERM) 5 % Place 1 patch onto the skin daily. Remove & Discard patch within 12 hours or as directed by MD   . omeprazole (PRILOSEC) 40 MG capsule TAKE ONE CAPSULE BY MOUTH IN THE MORNING ONE  HOUR  BEFORE  MEAL  . oxybutynin (DITROPAN-XL) 10 MG 24 hr tablet TAKE ONE TABLET BY MOUTH ONCE DAILY  . VYTORIN 10-80 MG per tablet TAKE ONE TABLET BY MOUTH EVERY DAY  . [DISCONTINUED] donepezil (ARICEPT) 5 MG tablet     Review of Systems  Constitutional: Negative.   HENT: Negative.   Eyes: Negative.   Respiratory: Negative.   Cardiovascular: Negative.     Gastrointestinal: Negative.   Endocrine: Negative.   Genitourinary: Negative.   Musculoskeletal: Negative.   Skin: Negative.   Allergic/Immunologic: Negative.   Neurological: Negative.   Hematological: Negative.   Psychiatric/Behavioral: Negative.        Objective:   Physical Exam  Nursing note and vitals reviewed. Constitutional: She is oriented to person, place, and time. She appears well-developed and well-nourished. No distress.  The patient presents today by herself, is alert, cooperative and looks much younger than her stated age of 80  HENT:  Head: Normocephalic and atraumatic.  Right Ear: External ear normal.  Left Ear: External ear normal.  Nose: Nose normal.  Mouth/Throat: Oropharynx is clear and moist. No oropharyngeal exudate.  Eyes: Conjunctivae and EOM are normal. Pupils are equal, round, and reactive to light. Right eye exhibits no discharge. Left eye exhibits no discharge. No scleral icterus.  Neck: Normal range of motion. Neck supple. No JVD present. No thyromegaly present.  No carotid bruits  Cardiovascular: Normal rate, regular rhythm, normal heart sounds and intact distal pulses.  Exam reveals no gallop and no friction rub.   No murmur heard. At 72 per minute  Pulmonary/Chest: Effort normal and breath sounds normal. No respiratory distress. She has no wheezes. She has no rales. She exhibits no tenderness.  Abdominal: Soft.  Bowel sounds are normal. She exhibits no mass. There is no tenderness. There is no rebound and no guarding.  Musculoskeletal: Normal range of motion. She exhibits no edema and no tenderness.  Patient has good movement without swelling or stiffness of joints and with assistance was able to get on the exam table without any problems  Lymphadenopathy:    She has no cervical adenopathy.  Neurological: She is alert and oriented to person, place, and time. She has normal reflexes. No cranial nerve deficit.  Skin: Skin is warm and dry. No rash  noted.  Psychiatric: She has a normal mood and affect. Her behavior is normal. Judgment and thought content normal.   BP 139/72  Pulse 68  Temp(Src) 97.6 F (36.4 C) (Oral)  Ht 5' 1"  (1.549 m)  Wt 127 lb (57.607 kg)  BMI 24.01 kg/m2        Assessment & Plan:  1. Vitamin D deficiency - POCT CBC - Vit D  25 hydroxy (rtn osteoporosis monitoring) - CK  2. Hyperlipidemia - POCT CBC - BMP8+EGFR - Lipid panel - Hepatic function panel - CK  3. Osteoporosis -Continue Evista  4. Leg cramps -Hold Vytorin  5. Memory impairment -Exercise your mind with socialization and, puzzles, and appropriate physical activity  Patient Instructions                       Medicare Annual Wellness Visit  [place] and the medical providers at [facility] strive to bring you the best medical care.  In doing so we not only want to address your current medical conditions and concerns but also to detect new conditions early and prevent illness, disease and health-related problems.    Medicare offers a yearly Wellness Visit which allows our clinical staff to assess your need for preventative services including immunizations, lifestyle education, counseling to decrease risk of preventable diseases and screening for fall risk and other medical concerns.    This visit is provided free of charge (no copay) for all Medicare recipients. The clinical pharmacists at [facility] have begun to conduct these Wellness Visits which will also include a thorough review of all your medications.    As you primary medical provider recommend that you make an appointment for your Annual Wellness Visit if you have not done so already this year.  You may set up this appointment before you leave today or you may call back [phone]) and schedule an appointment.  Please make sure when you call that you mention that you are scheduling your Annual Wellness Visit with the clinical  pharmacist so that the appointment may be made for the proper length of time.      Continue current medications. Continue good therapeutic lifestyle changes which include good diet and exercise. Fall precautions discussed with patient. If an FOBT was given today- please return it to our front desk. If you are over 74 years old - you may need Prevnar 10 or the adult Pneumonia vaccine.  HOLD VYTORIN FOR 3-4 MOS- UNTIL NEXT VISIT!   Arrie Senate MD

## 2014-03-10 LAB — BMP8+EGFR
BUN/Creatinine Ratio: 14 (ref 11–26)
BUN: 16 mg/dL (ref 10–36)
CALCIUM: 9.4 mg/dL (ref 8.7–10.3)
CHLORIDE: 103 mmol/L (ref 97–108)
CO2: 28 mmol/L (ref 18–29)
Creatinine, Ser: 1.16 mg/dL — ABNORMAL HIGH (ref 0.57–1.00)
GFR calc Af Amer: 46 mL/min/{1.73_m2} — ABNORMAL LOW (ref >59)
GFR, EST NON AFRICAN AMERICAN: 40 mL/min/{1.73_m2} — AB (ref >59)
GLUCOSE: 93 mg/dL (ref 65–99)
POTASSIUM: 4.4 mmol/L (ref 3.5–5.2)
SODIUM: 146 mmol/L — AB (ref 134–144)

## 2014-03-10 LAB — LIPID PANEL
Chol/HDL Ratio: 3.3 ratio units (ref 0.0–4.4)
Cholesterol, Total: 190 mg/dL (ref 100–199)
HDL: 58 mg/dL (ref >39)
LDL Calculated: 104 mg/dL — ABNORMAL HIGH (ref 0–99)
Triglycerides: 138 mg/dL (ref 0–149)
VLDL CHOLESTEROL CAL: 28 mg/dL (ref 5–40)

## 2014-03-10 LAB — HEPATIC FUNCTION PANEL
ALBUMIN: 4 g/dL (ref 3.2–4.6)
ALT: 12 IU/L (ref 0–32)
AST: 18 IU/L (ref 0–40)
Alkaline Phosphatase: 84 IU/L (ref 39–117)
BILIRUBIN TOTAL: 0.3 mg/dL (ref 0.0–1.2)
Bilirubin, Direct: 0.1 mg/dL (ref 0.00–0.40)
TOTAL PROTEIN: 6.2 g/dL (ref 6.0–8.5)

## 2014-03-10 LAB — CK: Total CK: 64 U/L (ref 24–173)

## 2014-03-10 LAB — VITAMIN D 25 HYDROXY (VIT D DEFICIENCY, FRACTURES): Vit D, 25-Hydroxy: 37.7 ng/mL (ref 30.0–100.0)

## 2014-03-15 DIAGNOSIS — F0390 Unspecified dementia without behavioral disturbance: Secondary | ICD-10-CM | POA: Diagnosis not present

## 2014-05-31 DIAGNOSIS — F0390 Unspecified dementia without behavioral disturbance: Secondary | ICD-10-CM | POA: Diagnosis not present

## 2014-07-14 ENCOUNTER — Ambulatory Visit: Payer: Medicare Other | Admitting: Family Medicine

## 2014-07-25 ENCOUNTER — Ambulatory Visit (INDEPENDENT_AMBULATORY_CARE_PROVIDER_SITE_OTHER): Payer: Medicare Other | Admitting: Family Medicine

## 2014-07-25 ENCOUNTER — Encounter: Payer: Self-pay | Admitting: Family Medicine

## 2014-07-25 ENCOUNTER — Other Ambulatory Visit: Payer: Self-pay | Admitting: Family Medicine

## 2014-07-25 VITALS — BP 120/80 | HR 63 | Temp 97.3°F | Ht 61.0 in | Wt 129.0 lb

## 2014-07-25 DIAGNOSIS — E785 Hyperlipidemia, unspecified: Secondary | ICD-10-CM

## 2014-07-25 DIAGNOSIS — R350 Frequency of micturition: Secondary | ICD-10-CM | POA: Diagnosis not present

## 2014-07-25 DIAGNOSIS — E559 Vitamin D deficiency, unspecified: Secondary | ICD-10-CM

## 2014-07-25 MED ORDER — OXYBUTYNIN CHLORIDE ER 10 MG PO TB24: ORAL_TABLET | ORAL | Status: DC

## 2014-07-25 NOTE — Progress Notes (Signed)
Subjective:    Patient ID: Courtney Frank, female    DOB: 05-Oct-1915, 80 y.o.   MRN: [id number]  HPI Pt here for follow up and management of chronic medical problems. The patient is in today with no specific complaints. She does need a refill on her oxybutynin. She is to get lab work today. She will be scheduled for mammogram as she leaves . The patient is alert smiling and in good spirits. She has a caregiver that comes in 4 hours daily.        Patient Active Problem List   Diagnosis Date Noted  . Osteoporosis 03/09/2014  . Vitamin D deficiency 11/16/2013  . Nocturnal leg cramps 11/16/2013  . Insomnia 11/16/2013  . Urine frequency 08/04/2013  . Need for prophylactic vaccination against Streptococcus pneumoniae (pneumococcus) 08/04/2013  . History of GI bleed   . Hyperlipidemia   . Incontinence   . Back pain   . Cataracts, bilateral   . Inguinal hernia    Outpatient Encounter Prescriptions as of 07/25/2014  Medication Sig  . acetaminophen (TYLENOL) 325 MG tablet Take 650 mg by mouth every 6 (six) hours as needed.    . calcium carbonate (OS-CAL) 600 MG TABS Take 600 mg by mouth 2 (two) times daily with a meal.    . Cholecalciferol (VITAMIN D3) 1000 UNITS CAPS Take 1 capsule by mouth daily.    Marland Kitchen EVISTA 60 MG tablet TAKE ONE TABLET BY MOUTH EVERY DAY  . lidocaine (LIDODERM) 5 % Place 1 patch onto the skin daily. Remove & Discard patch within 12 hours or as directed by MD   . omeprazole (PRILOSEC) 40 MG capsule TAKE ONE CAPSULE BY MOUTH IN THE MORNING ONE  HOUR  BEFORE  MEAL  . oxybutynin (DITROPAN-XL) 10 MG 24 hr tablet TAKE ONE TABLET BY MOUTH ONCE DAILY  . [DISCONTINUED] oxybutynin (DITROPAN-XL) 10 MG 24 hr tablet TAKE ONE TABLET BY MOUTH ONCE DAILY  . [DISCONTINUED] VYTORIN 10-80 MG per tablet TAKE ONE TABLET BY MOUTH EVERY DAY    Review of Systems  Constitutional: Negative.   HENT: Negative.   Eyes: Negative.   Respiratory: Negative.   Cardiovascular: Negative.     Gastrointestinal: Negative.   Endocrine: Negative.   Genitourinary: Negative.   Musculoskeletal: Negative.   Skin: Negative.   Allergic/Immunologic: Negative.   Neurological: Negative.   Hematological: Negative.   Psychiatric/Behavioral: Negative.        Objective:   Physical Exam  Nursing note and vitals reviewed. Constitutional: She is oriented to person, place, and time. She appears well-developed and well-nourished.  The patient looks and acts excellent for her age of 65  HENT:  Head: Normocephalic and atraumatic.  Right Ear: External ear normal.  Left Ear: External ear normal.  Nose: Nose normal.  Mouth/Throat: Oropharynx is clear and moist. No oropharyngeal exudate.  Eyes: Conjunctivae and EOM are normal. Pupils are equal, round, and reactive to light. Right eye exhibits no discharge. Left eye exhibits no discharge. No scleral icterus.  Neck: Normal range of motion. Neck supple. No thyromegaly present.  No carotid bruits  Cardiovascular: Normal rate, regular rhythm, normal heart sounds and intact distal pulses.  Exam reveals no gallop and no friction rub.   No murmur heard. At 72 per minute  Pulmonary/Chest: Effort normal and breath sounds normal. No respiratory distress. She has no wheezes. She has no rales. She exhibits no tenderness.  Abdominal: Soft. Bowel sounds are normal. She exhibits no mass. There is no tenderness.  There is no rebound and no guarding.  No abdominal bruits or tenderness. Good inguinal pulses  Musculoskeletal: Normal range of motion. She exhibits no edema and no tenderness.  Lymphadenopathy:    She has no cervical adenopathy.  Neurological: She is alert and oriented to person, place, and time. She has normal reflexes. No cranial nerve deficit.  Skin: Skin is warm and dry. No rash noted. No erythema.  Psychiatric: She has a normal mood and affect. Her behavior is normal. Judgment and thought content normal.   BP 167/70  Pulse 63  Temp(Src) 97.3  F (36.3 C) (Oral)  Ht 5' 1"  (1.549 m)  Wt 129 lb (58.514 kg)  BMI 24.39 kg/m2        Assessment & Plan:  1. Vitamin D deficiency - POCT CBC - Vit D  25 hydroxy (rtn osteoporosis monitoring)  2. Hyperlipidemia - POCT CBC - BMP8+EGFR - Hepatic function panel - Lipid panel  3. Urine frequency  Meds ordered this encounter  Medications  . oxybutynin (DITROPAN-XL) 10 MG 24 hr tablet    Sig: TAKE ONE TABLET BY MOUTH ONCE DAILY    Dispense:  30 tablet    Refill:  5   Patient Instructions                       Medicare Annual Wellness Visit  [facility] and the medical providers at [facility] strive to bring you the best medical care.  In doing so we not only want to address your current medical conditions and concerns but also to detect new conditions early and prevent illness, disease and health-related problems.    Medicare offers a yearly Wellness Visit which allows our clinical staff to assess your need for preventative services including immunizations, lifestyle education, counseling to decrease risk of preventable diseases and screening for fall risk and other medical concerns.    This visit is provided free of charge (no copay) for all Medicare recipients. The clinical pharmacists at [facility] have begun to conduct these Wellness Visits which will also include a thorough review of all your medications.    As you primary medical provider recommend that you make an appointment for your Annual Wellness Visit if you have not done so already this year.  You may set up this appointment before you leave today or you may call back [phone]) and schedule an appointment.  Please make sure when you call that you mention that you are scheduling your Annual Wellness Visit with the clinical pharmacist so that the appointment may be made for the proper length of time.      Continue current medications. Continue good therapeutic lifestyle  changes which include good diet and exercise. Fall precautions discussed with patient. If an FOBT was given today- please return it to our front desk. If you are over 52 years old - you may need Prevnar 80 or the adult Pneumonia vaccine.  Flu Shots will be available at our office starting mid- September. Please call and schedule a FLU CLINIC APPOINTMENT.   Continue to be careful and to not put herself at risk for falling Stay active, stimulate her senses with reading hearing etc.   Arrie Senate MD

## 2014-07-25 NOTE — Patient Instructions (Addendum)
Medicare Annual Wellness Visit  [facility] and the medical providers at [facility] strive to bring you the best medical care.  In doing so we not only want to address your current medical conditions and concerns but also to detect new conditions early and prevent illness, disease and health-related problems.    Medicare offers a yearly Wellness Visit which allows our clinical staff to assess your need for preventative services including immunizations, lifestyle education, counseling to decrease risk of preventable diseases and screening for fall risk and other medical concerns.    This visit is provided free of charge (no copay) for all Medicare recipients. The clinical pharmacists at [facility] have begun to conduct these Wellness Visits which will also include a thorough review of all your medications.    As you primary medical provider recommend that you make an appointment for your Annual Wellness Visit if you have not done so already this year.  You may set up this appointment before you leave today or you may call back [phone]) and schedule an appointment.  Please make sure when you call that you mention that you are scheduling your Annual Wellness Visit with the clinical pharmacist so that the appointment may be made for the proper length of time.      Continue current medications. Continue good therapeutic lifestyle changes which include good diet and exercise. Fall precautions discussed with patient. If an FOBT was given today- please return it to our front desk. If you are over 37 years old - you may need Prevnar 13 or the adult Pneumonia vaccine.  Flu Shots will be available at our office starting mid- September. Please call and schedule a FLU CLINIC APPOINTMENT.   Continue to be careful and to not put herself at risk for falling Stay active, stimulate her senses with reading hearing etc.

## 2014-07-29 ENCOUNTER — Ambulatory Visit: Payer: Medicare Other | Admitting: Family Medicine

## 2014-09-07 ENCOUNTER — Encounter: Payer: Self-pay | Admitting: Family Medicine

## 2014-09-07 ENCOUNTER — Ambulatory Visit (INDEPENDENT_AMBULATORY_CARE_PROVIDER_SITE_OTHER): Payer: Medicare Other | Admitting: Family Medicine

## 2014-09-07 ENCOUNTER — Ambulatory Visit: Payer: Self-pay | Admitting: Pharmacist

## 2014-09-07 VITALS — BP 130/70 | HR 69 | Temp 96.4°F | Ht 61.0 in | Wt 130.2 lb

## 2014-09-07 DIAGNOSIS — H3560 Retinal hemorrhage, unspecified eye: Secondary | ICD-10-CM | POA: Diagnosis not present

## 2014-09-07 DIAGNOSIS — E785 Hyperlipidemia, unspecified: Secondary | ICD-10-CM

## 2014-09-07 DIAGNOSIS — Z23 Encounter for immunization: Secondary | ICD-10-CM

## 2014-09-07 DIAGNOSIS — H3561 Retinal hemorrhage, right eye: Secondary | ICD-10-CM | POA: Diagnosis not present

## 2014-09-07 DIAGNOSIS — E559 Vitamin D deficiency, unspecified: Secondary | ICD-10-CM | POA: Diagnosis not present

## 2014-09-07 LAB — POCT CBC
Granulocyte percent: 69.3 %G (ref 37–80)
HCT, POC: 41.2 % (ref 37.7–47.9)
Hemoglobin: 14.2 g/dL (ref 12.2–16.2)
Lymph, poc: 0.9 (ref 0.6–3.4)
MCH: 32.3 pg — AB (ref 27–31.2)
MCHC: 34.4 g/dL (ref 31.8–35.4)
MCV: 93.9 fL (ref 80–97)
MPV: 7.7 fL (ref 0–99.8)
POC GRANULOCYTE: 2.4 (ref 2–6.9)
POC LYMPH %: 25 % (ref 10–50)
Platelet Count, POC: 206 10*3/uL (ref 142–424)
RBC: 4.4 M/uL (ref 4.04–5.48)
RDW, POC: 13.7 %
WBC: 3.5 10*3/uL — AB (ref 4.6–10.2)

## 2014-09-07 LAB — GLUCOSE, POCT (MANUAL RESULT ENTRY): POC GLUCOSE: 72 mg/dL (ref 70–99)

## 2014-09-07 LAB — POCT INR: INR: 0.9

## 2014-09-07 NOTE — Progress Notes (Signed)
Subjective:    Patient ID: Courtney LongestGladys M Frank, female    DOB: [birth year], 80 y.o.   MRN: [id number]  HPI Pt here today to follow up from eye doctor visit, where she was told she has intra-retinal hemorrhages in the right eye and also complains of dizziness. The patient comes with her caregiver. She is pleasant and alert as usual and overall is doing extremely well for her age of 80 almost 89 years. The note from the optometrist was reviewed and will be scanned into the record        Patient Active Problem List   Diagnosis Date Noted  . Osteoporosis 03/09/2014  . Vitamin D deficiency 11/16/2013  . Nocturnal leg cramps 11/16/2013  . Insomnia 11/16/2013  . Urine frequency 08/04/2013  . Need for prophylactic vaccination against Streptococcus pneumoniae (pneumococcus) 08/04/2013  . History of GI bleed   . Hyperlipidemia   . Incontinence   . Back pain   . Cataracts, bilateral   . Inguinal hernia    Outpatient Encounter Prescriptions as of 09/07/2014  Medication Sig  . acetaminophen (TYLENOL) 325 MG tablet Take 650 mg by mouth every 6 (six) hours as needed.    . calcium carbonate (OS-CAL) 600 MG TABS Take 600 mg by mouth 2 (two) times daily with a meal.    . Cholecalciferol (VITAMIN D3) 1000 UNITS CAPS Take 1 capsule by mouth daily.    Marland Kitchen. EVISTA 60 MG tablet TAKE ONE TABLET BY MOUTH EVERY DAY  . lidocaine (LIDODERM) 5 % Place 1 patch onto the skin daily. Remove & Discard patch within 12 hours or as directed by MD   . omeprazole (PRILOSEC) 40 MG capsule TAKE ONE CAPSULE BY MOUTH IN THE MORNING ONE  HOUR  BEFORE  MEAL  . oxybutynin (DITROPAN-XL) 10 MG 24 hr tablet TAKE ONE TABLET BY MOUTH ONCE DAILY    Review of Systems  Constitutional: Negative.   HENT: Negative.   Eyes: Positive for visual disturbance (blurred vision in right eye).  Respiratory: Negative.   Cardiovascular: Negative.   Gastrointestinal: Negative.   Endocrine: Negative.   Genitourinary: Negative.     Musculoskeletal: Negative.   Skin: Negative.   Allergic/Immunologic: Negative.   Neurological: Positive for dizziness.  Hematological: Negative.   Psychiatric/Behavioral: Negative.        Objective:   Physical Exam  Nursing note and vitals reviewed. Constitutional: She is oriented to person, place, and time. She appears well-developed and well-nourished. No distress.  HENT:  Head: Normocephalic and atraumatic.  Right Ear: External ear normal.  Left Ear: External ear normal.  Nose: Nose normal.  Mouth/Throat: Oropharynx is clear and moist.  Eyes: Conjunctivae and EOM are normal. Pupils are equal, round, and reactive to light. Right eye exhibits no discharge. Left eye exhibits no discharge.  The eyes were not dilated. There was a small hemorrhage observed on the retina behind the right eye the retina on the left was difficult to visualize.  Neck: Normal range of motion. Neck supple. No thyromegaly present.  Cardiovascular: Normal rate, regular rhythm and normal heart sounds.   No murmur heard. At 60/m  Pulmonary/Chest: Effort normal and breath sounds normal. No respiratory distress. She has no wheezes. She has no rales.  Musculoskeletal: Normal range of motion. She exhibits no edema.  Lymphadenopathy:    She has no cervical adenopathy.  Neurological: She is alert and oriented to person, place, and time.  Skin: Skin is warm and dry. No rash noted.  Psychiatric: She  has a normal mood and affect. Her behavior is normal. Judgment and thought content normal.  The patient is alert as usual   BP 130/70  Pulse 69  Temp(Src) 96.4 F (35.8 C)  Ht 5\' 1"  (1.549 m)  Wt 130 lb 4 oz (59.081 kg)  BMI 24.62 kg/m2         Assessment & Plan:   1. Retinal hemorrhage noted on examination, right - POCT CBC - POCT glucose (manual entry) - Ambulatory referral to Anticoagulation Monitoring - POCT INR; Standing - APTT - Hepatic function panel - Ambulatory referral to Ophthalmology  2.  Vitamin D deficiency  3. Hyperlipidemia  Patient Instructions  We will arrange for you to have a visit with the ophthalmologist [facility] [place]. We would ask that you come by and pick up a copy of the lab work that we do today to take with you to that visit Continue to be careful and do not put yourself at risk for falling   Nyra Capeson W. Rulon Abdalla MD

## 2014-09-07 NOTE — Patient Instructions (Signed)
We will arrange for you to have a visit with the ophthalmologist because [facility] [place]. We would ask that you come by and pick up a copy of the lab work that we do today to take with you to that visit Continue to be careful and do not put yourself at risk for falling

## 2014-09-08 ENCOUNTER — Other Ambulatory Visit: Payer: Self-pay | Admitting: *Deleted

## 2014-09-08 ENCOUNTER — Telehealth: Payer: Self-pay | Admitting: *Deleted

## 2014-09-08 LAB — HEPATIC FUNCTION PANEL
ALK PHOS: 98 IU/L (ref 39–117)
ALT: 9 IU/L (ref 0–32)
AST: 12 IU/L (ref 0–40)
Albumin: 3.4 g/dL (ref 3.2–4.6)
Bilirubin, Direct: 0.06 mg/dL (ref 0.00–0.40)
Total Bilirubin: 0.2 mg/dL (ref 0.0–1.2)
Total Protein: 6.2 g/dL (ref 6.0–8.5)

## 2014-09-08 LAB — APTT: aPTT: 29 s (ref 24–33)

## 2014-09-08 MED ORDER — RALOXIFENE HCL 60 MG PO TABS: ORAL_TABLET | ORAL | Status: DC

## 2014-09-08 MED ORDER — OMEPRAZOLE 40 MG PO CPDR: DELAYED_RELEASE_CAPSULE | ORAL | Status: DC

## 2014-09-08 NOTE — Telephone Encounter (Signed)
Notified of results Verbalizes understanding Copy to front for pt pick up

## 2014-09-08 NOTE — Telephone Encounter (Signed)
Message copied by Bearl MulberryUTHERFORD, NATALIE K on Thu Sep 08, 2014 10:32 AM ------      Message from: Ernestina PennaMOORE, DONALD W      Created: Thu Sep 08, 2014  7:32 AM       The prothrombin time is within normal limits.       The liver function tests are all within normal limits.      Please make a copy of all of this lab work and give it to the patient's caregiver to take with her to her ophthalmology visit ------

## 2014-09-13 ENCOUNTER — Ambulatory Visit: Payer: Medicare Other | Admitting: Family Medicine

## 2014-09-13 DIAGNOSIS — H34812 Central retinal vein occlusion, left eye: Secondary | ICD-10-CM | POA: Diagnosis not present

## 2014-09-13 DIAGNOSIS — H35351 Cystoid macular degeneration, right eye: Secondary | ICD-10-CM | POA: Diagnosis not present

## 2014-10-25 DIAGNOSIS — N3941 Urge incontinence: Secondary | ICD-10-CM | POA: Diagnosis not present

## 2014-10-25 DIAGNOSIS — H34812 Central retinal vein occlusion, left eye: Secondary | ICD-10-CM | POA: Diagnosis not present

## 2014-10-25 DIAGNOSIS — H35373 Puckering of macula, bilateral: Secondary | ICD-10-CM | POA: Diagnosis not present

## 2014-11-15 ENCOUNTER — Telehealth: Payer: Self-pay | Admitting: Family Medicine

## 2014-11-15 NOTE — Telephone Encounter (Signed)
Spoke with Asher MuirJamie and they will call him on the 19th after appointment with Christell ConstantMoore. Patient son aware and verbalizes understanding.

## 2014-11-29 ENCOUNTER — Ambulatory Visit: Payer: Medicare Other | Admitting: Family Medicine

## 2014-11-29 DIAGNOSIS — F0390 Unspecified dementia without behavioral disturbance: Secondary | ICD-10-CM | POA: Diagnosis not present

## 2014-12-13 ENCOUNTER — Encounter: Payer: Self-pay | Admitting: Family Medicine

## 2014-12-13 ENCOUNTER — Ambulatory Visit (INDEPENDENT_AMBULATORY_CARE_PROVIDER_SITE_OTHER): Payer: Medicare Other | Admitting: Family Medicine

## 2014-12-13 VITALS — BP 124/77 | HR 80 | Temp 97.2°F | Ht 61.0 in | Wt 127.0 lb

## 2014-12-13 DIAGNOSIS — K4090 Unilateral inguinal hernia, without obstruction or gangrene, not specified as recurrent: Secondary | ICD-10-CM

## 2014-12-13 DIAGNOSIS — E785 Hyperlipidemia, unspecified: Secondary | ICD-10-CM | POA: Diagnosis not present

## 2014-12-13 DIAGNOSIS — E559 Vitamin D deficiency, unspecified: Secondary | ICD-10-CM | POA: Diagnosis not present

## 2014-12-13 DIAGNOSIS — R42 Dizziness and giddiness: Secondary | ICD-10-CM

## 2014-12-13 LAB — POCT CBC
Granulocyte percent: 66.7 %G (ref 37–80)
HCT, POC: 43.7 % (ref 37.7–47.9)
Hemoglobin: 13.6 g/dL (ref 12.2–16.2)
Lymph, poc: 1 (ref 0.6–3.4)
MCH, POC: 29.3 pg (ref 27–31.2)
MCHC: 31.1 g/dL — AB (ref 31.8–35.4)
MCV: 94.3 fL (ref 80–97)
MPV: 7.3 fL (ref 0–99.8)
POC Granulocyte: 2.5 (ref 2–6.9)
POC LYMPH %: 26.5 % (ref 10–50)
Platelet Count, POC: 234 10*3/uL (ref 142–424)
RBC: 4.6 M/uL (ref 4.04–5.48)
RDW, POC: 14.4 %
WBC: 3.8 10*3/uL — AB (ref 4.6–10.2)

## 2014-12-13 NOTE — Progress Notes (Signed)
Subjective:    Patient ID: Courtney Frank, female    DOB: 12/30/1914, 81 y.o.   MRN: [id number]  HPI Pt here for follow up and management of chronic medical problems which includes hyperlipidemia. She is taking medications regularly. The patient does complain of some dizziness at times. She is due to get an FOBT and she will get lab work today to monitor her cholesterol . The patient indicates that leaving off the cholesterol medicine has not made her memory get any better. She does complain of fullness in her left side when she is standing. She lives at home by herself and has someone who comes in daily until after lunch. We had a long discussion about assisted living versus staying at home and she still prefers to stay at home because she has neighbors and family members that are not too far from where she lives.        Patient Active Problem List   Diagnosis Date Noted  . Retinal hemorrhage noted on examination, right 09/07/2014  . Osteoporosis 03/09/2014  . Vitamin D deficiency 11/16/2013  . Nocturnal leg cramps 11/16/2013  . Insomnia 11/16/2013  . Urine frequency 08/04/2013  . Need for prophylactic vaccination against Streptococcus pneumoniae (pneumococcus) 08/04/2013  . History of GI bleed   . Hyperlipidemia   . Incontinence   . Back pain   . Cataracts, bilateral   . Inguinal hernia    Outpatient Encounter Prescriptions as of 12/13/2014  Medication Sig  . acetaminophen (TYLENOL) 325 MG tablet Take 650 mg by mouth every 6 (six) hours as needed.    . calcium carbonate (OS-CAL) 600 MG TABS Take 600 mg by mouth 2 (two) times daily with a meal.    . Cholecalciferol (VITAMIN D3) 1000 UNITS CAPS Take 1 capsule by mouth daily.    Marland Kitchen omeprazole (PRILOSEC) 40 MG capsule TAKE ONE CAPSULE BY MOUTH IN THE MORNING ONE  HOUR  BEFORE  MEAL  . oxybutynin (DITROPAN-XL) 10 MG 24 hr tablet TAKE ONE TABLET BY MOUTH ONCE DAILY  . raloxifene (EVISTA) 60 MG tablet TAKE ONE TABLET BY MOUTH EVERY DAY   . lidocaine (LIDODERM) 5 % Place 1 patch onto the skin daily. Remove & Discard patch within 12 hours or as directed by MD     Review of Systems  Constitutional: Negative.   HENT: Negative.   Eyes: Negative.   Respiratory: Negative.   Cardiovascular: Negative.   Gastrointestinal: Negative.   Endocrine: Negative.   Genitourinary: Negative.   Musculoskeletal: Negative.   Skin: Negative.   Allergic/Immunologic: Negative.   Neurological: Positive for dizziness (at times).  Hematological: Negative.   Psychiatric/Behavioral: Negative.        Objective:   Physical Exam  Constitutional: She is oriented to person, place, and time. She appears well-developed and well-nourished. No distress.  The patient looks and acts much younger than her stated age of 57 years. She was alert.  HENT:  Head: Normocephalic and atraumatic.  Right Ear: External ear normal.  Left Ear: External ear normal.  Nose: Nose normal.  Mouth/Throat: Oropharynx is clear and moist. No oropharyngeal exudate.  Eyes: Conjunctivae and EOM are normal. Pupils are equal, round, and reactive to light. Right eye exhibits no discharge. Left eye exhibits no discharge. No scleral icterus.  Neck: Normal range of motion. Neck supple. No thyromegaly present.  There is no thyromegaly or carotid bruits.  Cardiovascular: Normal rate, regular rhythm and normal heart sounds.   No murmur heard. The heart  has a regular rate and rhythm at 72/m.  Pulmonary/Chest: Effort normal and breath sounds normal. No respiratory distress. She has no wheezes. She has no rales. She exhibits no tenderness.  Lungs were clear anteriorly and posteriorly  Abdominal: Soft. Bowel sounds are normal. She exhibits no mass. There is no tenderness. There is no rebound and no guarding.  With standing there is a prominent bulging in the left lower quadrant and inguinal area. This was not apparent when she was supine. There is no abdominal tenderness or masses.    Musculoskeletal: Normal range of motion. She exhibits no edema or tenderness.  The patient's mobility appeared good and her gait seems stable.  Lymphadenopathy:    She has no cervical adenopathy.  Neurological: She is alert and oriented to person, place, and time. She has normal reflexes. No cranial nerve deficit.  Skin: Skin is warm and dry. No rash noted.  Psychiatric: She has a normal mood and affect. Her behavior is normal. Judgment and thought content normal.  Nursing note and vitals reviewed.  BP 124/77 mmHg  Pulse 80  Temp(Src) 97.2 F (36.2 C) (Oral)  Ht 5' 1"  (1.549 m)  Wt 127 lb (57.607 kg)  BMI 24.01 kg/m2        Assessment & Plan:  1. Vitamin D deficiency See future lab work for any changes in treatment of this problem - POCT CBC - Vit D  25 hydroxy (rtn osteoporosis monitoring)  2. Hyperlipidemia -We will continue to ask her not to take any more statin drugs and we will see what the cholesterol numbers look like since she has not been taking her Vytorin. - POCT CBC - BMP8+EGFR - Lipid panel - Hepatic function panel  3. Dizziness -We will continue to monitor this and we will make sure that all of her lab work is stable and not contributing to her dizziness - POCT CBC - Thyroid Panel With TSH  4. Unilateral inguinal hernia without obstruction or gangrene, recurrence not specified - Ambulatory referral to [facility]  Patient Instructions                       Medicare Annual Wellness Visit  [facility] and the medical providers at [facility] strive to bring you the best medical care.  In doing so we not only want to address your current medical conditions and concerns but also to detect new conditions early and prevent illness, disease and health-related problems.    Medicare offers a yearly Wellness Visit which allows our clinical staff to assess your need for preventative  services including immunizations, lifestyle education, counseling to decrease risk of preventable diseases and screening for fall risk and other medical concerns.    This visit is provided free of charge (no copay) for all Medicare recipients. The clinical pharmacists at [facility] have begun to conduct these Wellness Visits which will also include a thorough review of all your medications.    As you primary medical provider recommend that you make an appointment for your Annual Wellness Visit if you have not done so already this year.  You may set up this appointment before you leave today or you may call back ([phone]) and schedule an appointment.  Please make sure when you call that you mention that you are scheduling your Annual Wellness Visit with the clinical pharmacist so that the appointment may be made for the proper length  of time.     Continue current medications. Continue good therapeutic lifestyle changes which include good diet and exercise. Fall precautions discussed with patient. If an FOBT was given today- please return it to our front desk. If you are over 54 years old - you may need Prevnar 77 or the adult Pneumonia vaccine.  Flu Shots are still available at our office. If you still haven't had one please call to set up a nurse visit to get one.   After your visit with [place] today you will receive a survey in the mail or online from [employer] regarding your care with [place]. Please take a moment to fill this out. Your feedback is very important to [place] as you can help [place] better understand your patient needs as well as improve your experience and satisfaction. WE CARE ABOUT YOU!!!  We will make a referral for you to see a surgeon at [facility] to evaluate the enlarging left inguinal hernia that we discussed today at the office visit Please continue to be careful and always wear your Lifeline and use this if you fall or have  problems at home. Also if the inguinal hernia becomes painful you should go to the hospital immediately. Continue to drink plenty of fluids and stay as active as possible at home and eat as healthy as possible.   Arrie Senate MD

## 2014-12-13 NOTE — Patient Instructions (Addendum)
Medicare Annual Wellness Visit  [place] and the medical providers at [facility] strive to bring you the best medical care.  In doing so we not only want to address your current medical conditions and concerns but also to detect new conditions early and prevent illness, disease and health-related problems.    Medicare offers a yearly Wellness Visit which allows our clinical staff to assess your need for preventative services including immunizations, lifestyle education, counseling to decrease risk of preventable diseases and screening for fall risk and other medical concerns.    This visit is provided free of charge (no copay) for all Medicare recipients. The clinical pharmacists at [facility] have begun to conduct these Wellness Visits which will also include a thorough review of all your medications.    As you primary medical provider recommend that you make an appointment for your Annual Wellness Visit if you have not done so already this year.  You may set up this appointment before you leave today or you may call back [phone] and schedule an appointment.  Please make sure when you call that you mention that you are scheduling your Annual Wellness Visit with the clinical pharmacist so that the appointment may be made for the proper length of time.     Continue current medications. Continue good therapeutic lifestyle changes which include good diet and exercise. Fall precautions discussed with patient. If an FOBT was given today- please return it to our front desk. If you are over 79 years old - you may need Prevnar 13 or the adult Pneumonia vaccine.  Flu Shots are still available at our office. If you still haven't had one please call to set up a nurse visit to get one.   After your visit with us today you will receive a survey in the mail or online from [employer] regarding your care with us. Please take a moment to  fill this out. Your feedback is very important to us as you can help us better understand your patient needs as well as improve your experience and satisfaction. WE CARE ABOUT YOU!!!  We will make a referral for you to see a [facility] to evaluate the enlarging left inguinal hernia that we discussed today at the office visit Please continue to be careful and always wear your Lifeline and use this if you fall or have problems at home. Also if the inguinal hernia becomes painful you should go to the hospital immediately. Continue to drink plenty of fluids and stay as active as possible at home and eat as healthy as possible.

## 2014-12-14 ENCOUNTER — Telehealth: Payer: Self-pay | Admitting: Family Medicine

## 2014-12-14 LAB — HEPATIC FUNCTION PANEL
ALT: 5 IU/L (ref 0–32)
AST: 14 IU/L (ref 0–40)
Albumin: 3.6 g/dL (ref 3.2–4.6)
Alkaline Phosphatase: 93 IU/L (ref 39–117)
BILIRUBIN TOTAL: 0.3 mg/dL (ref 0.0–1.2)
Bilirubin, Direct: 0.09 mg/dL (ref 0.00–0.40)
TOTAL PROTEIN: 6.1 g/dL (ref 6.0–8.5)

## 2014-12-14 LAB — BMP8+EGFR
BUN/Creatinine Ratio: 18 (ref 11–26)
BUN: 16 mg/dL (ref 10–36)
CHLORIDE: 103 mmol/L (ref 97–108)
CO2: 26 mmol/L (ref 18–29)
Calcium: 9.4 mg/dL (ref 8.7–10.3)
Creatinine, Ser: 0.87 mg/dL (ref 0.57–1.00)
GFR calc Af Amer: 65 mL/min/{1.73_m2} (ref >59)
GFR, EST NON AFRICAN AMERICAN: 56 mL/min/{1.73_m2} — AB (ref >59)
Glucose: 90 mg/dL (ref 65–99)
Potassium: 4.2 mmol/L (ref 3.5–5.2)
Sodium: 144 mmol/L (ref 134–144)

## 2014-12-14 LAB — LIPID PANEL
CHOL/HDL RATIO: 5.9 ratio — AB (ref 0.0–4.4)
CHOLESTEROL TOTAL: 281 mg/dL — AB (ref 100–199)
HDL: 48 mg/dL (ref >39)
LDL CALC: 201 mg/dL — AB (ref 0–99)
TRIGLYCERIDES: 162 mg/dL — AB (ref 0–149)
VLDL Cholesterol Cal: 32 mg/dL (ref 5–40)

## 2014-12-14 LAB — THYROID PANEL WITH TSH
FREE THYROXINE INDEX: 2.8 (ref 1.2–4.9)
T3 UPTAKE RATIO: 26 % (ref 24–39)
T4 TOTAL: 10.8 ug/dL (ref 4.5–12.0)
TSH: 1.63 u[IU]/mL (ref 0.450–4.500)

## 2014-12-14 LAB — VITAMIN D 25 HYDROXY (VIT D DEFICIENCY, FRACTURES): Vit D, 25-Hydroxy: 36.9 ng/mL (ref 30.0–100.0)

## 2014-12-14 NOTE — Telephone Encounter (Signed)
-----   Message from Ernestina Pennaonald W Moore, MD sent at 12/14/2014  7:22 AM EST ----- The blood sugar is good at 90. The creatinine the most important kidney function test is within normal limits. The electrolytes including potassium are within normal limits. A traditional lipid panel has a total cholesterol, triglycerides, and LDL C elevated. The patient has not been taking the Vytorin. Please call a prescription in for Lipitor generic 20 mg 1 daily #30 refill as needed and have her recheck a traditional lipid liver panel in 6 weeks++++++ All liver function tests are within normal limits The vitamin D level is stable and good at 36.9, continue current vitamin D All thyroid function tests are within normal limits----no treatment is necessary for this

## 2014-12-20 ENCOUNTER — Encounter: Payer: Self-pay | Admitting: Family Medicine

## 2014-12-20 MED ORDER — ATORVASTATIN CALCIUM 20 MG PO TABS: 20.0000 mg | ORAL_TABLET | Freq: Every day | ORAL | Status: DC

## 2014-12-20 NOTE — Addendum Note (Signed)
Addended by: Magdalene RiverBULLINS, Shyteria Lewis H on: 12/20/2014 09:13 AM   Modules accepted: Orders

## 2015-01-23 ENCOUNTER — Telehealth: Payer: Self-pay | Admitting: Family Medicine

## 2015-01-23 NOTE — Telephone Encounter (Signed)
More time if possible

## 2015-01-23 NOTE — Telephone Encounter (Signed)
Call that had discussion with the patient's son regarding home care for his mother how long someone needed to stay there.

## 2015-01-23 NOTE — Telephone Encounter (Signed)
Long term care insurance will need to be renewed soon   Sherrine MaplesGlenn (son)  Wonders your thoughts of how much in- home care is really needed for his mother?  She right now has 2 hours of in home care a day. Do you think this is ok, or would you recommend more time .

## 2015-01-23 NOTE — Telephone Encounter (Signed)
Son would like a call from you at your convenience

## 2015-01-26 ENCOUNTER — Telehealth: Payer: Self-pay | Admitting: Family Medicine

## 2015-01-26 NOTE — Telephone Encounter (Signed)
It is okay to write this letter that the patient is competent to make financial decisions

## 2015-01-27 NOTE — Telephone Encounter (Signed)
I called and left message for Courtney Frank to call us back in regards to this letter. Her son is not on her HIPPA form so we need to speak with Courtney Frank before sending letter.

## 2015-01-30 NOTE — Telephone Encounter (Addendum)
Pt states she thought she already has letter stating she is competent to make financial decisions  She is going to check and get in touch with Glenn-son.

## 2015-02-16 DIAGNOSIS — H34811 Central retinal vein occlusion, right eye: Secondary | ICD-10-CM | POA: Diagnosis not present

## 2015-02-16 DIAGNOSIS — H40051 Ocular hypertension, right eye: Secondary | ICD-10-CM | POA: Diagnosis not present

## 2015-02-16 DIAGNOSIS — H18421 Band keratopathy, right eye: Secondary | ICD-10-CM | POA: Diagnosis not present

## 2015-02-20 ENCOUNTER — Telehealth: Payer: Self-pay | Admitting: Family Medicine

## 2015-02-20 NOTE — Telephone Encounter (Signed)
Letter was done and pt family aware .

## 2015-02-21 DIAGNOSIS — H34811 Central retinal vein occlusion, right eye: Secondary | ICD-10-CM | POA: Diagnosis not present

## 2015-02-21 DIAGNOSIS — H18421 Band keratopathy, right eye: Secondary | ICD-10-CM | POA: Diagnosis not present

## 2015-02-21 DIAGNOSIS — F0390 Unspecified dementia without behavioral disturbance: Secondary | ICD-10-CM | POA: Diagnosis not present

## 2015-02-21 DIAGNOSIS — H40051 Ocular hypertension, right eye: Secondary | ICD-10-CM | POA: Diagnosis not present

## 2015-02-21 DIAGNOSIS — H35351 Cystoid macular degeneration, right eye: Secondary | ICD-10-CM | POA: Diagnosis not present

## 2015-02-21 DIAGNOSIS — H59031 Cystoid macular edema following cataract surgery, right eye: Secondary | ICD-10-CM | POA: Diagnosis not present

## 2015-03-17 DIAGNOSIS — H35373 Puckering of macula, bilateral: Secondary | ICD-10-CM | POA: Diagnosis not present

## 2015-03-17 DIAGNOSIS — H35351 Cystoid macular degeneration, right eye: Secondary | ICD-10-CM | POA: Diagnosis not present

## 2015-03-17 DIAGNOSIS — Z5189 Encounter for other specified aftercare: Secondary | ICD-10-CM | POA: Diagnosis not present

## 2015-03-17 DIAGNOSIS — H34811 Central retinal vein occlusion, right eye: Secondary | ICD-10-CM | POA: Diagnosis not present

## 2015-04-14 DIAGNOSIS — H34811 Central retinal vein occlusion, right eye: Secondary | ICD-10-CM | POA: Diagnosis not present

## 2015-04-14 DIAGNOSIS — Z888 Allergy status to other drugs, medicaments and biological substances status: Secondary | ICD-10-CM | POA: Diagnosis not present

## 2015-04-14 DIAGNOSIS — H35351 Cystoid macular degeneration, right eye: Secondary | ICD-10-CM | POA: Diagnosis not present

## 2015-04-14 DIAGNOSIS — H35373 Puckering of macula, bilateral: Secondary | ICD-10-CM | POA: Diagnosis not present

## 2015-04-20 ENCOUNTER — Ambulatory Visit: Payer: Medicare Other | Admitting: Family Medicine

## 2015-04-25 ENCOUNTER — Other Ambulatory Visit: Payer: Self-pay | Admitting: *Deleted

## 2015-04-25 ENCOUNTER — Other Ambulatory Visit: Payer: Self-pay | Admitting: Family Medicine

## 2015-04-25 MED ORDER — OMEPRAZOLE 40 MG PO CPDR: DELAYED_RELEASE_CAPSULE | ORAL | Status: DC

## 2015-04-25 MED ORDER — RALOXIFENE HCL 60 MG PO TABS: ORAL_TABLET | ORAL | Status: DC

## 2015-05-11 DIAGNOSIS — I70203 Unspecified atherosclerosis of native arteries of extremities, bilateral legs: Secondary | ICD-10-CM | POA: Diagnosis not present

## 2015-05-19 ENCOUNTER — Encounter: Payer: Self-pay | Admitting: Family Medicine

## 2015-05-19 ENCOUNTER — Ambulatory Visit (INDEPENDENT_AMBULATORY_CARE_PROVIDER_SITE_OTHER): Payer: Medicare Other

## 2015-05-19 ENCOUNTER — Ambulatory Visit (INDEPENDENT_AMBULATORY_CARE_PROVIDER_SITE_OTHER): Payer: Medicare Other | Admitting: Family Medicine

## 2015-05-19 VITALS — BP 118/77 | HR 92 | Temp 97.5°F | Ht 61.0 in | Wt 127.0 lb

## 2015-05-19 DIAGNOSIS — E785 Hyperlipidemia, unspecified: Secondary | ICD-10-CM

## 2015-05-19 DIAGNOSIS — E559 Vitamin D deficiency, unspecified: Secondary | ICD-10-CM | POA: Diagnosis not present

## 2015-05-19 DIAGNOSIS — H3561 Retinal hemorrhage, right eye: Secondary | ICD-10-CM | POA: Diagnosis not present

## 2015-05-19 DIAGNOSIS — M25561 Pain in right knee: Secondary | ICD-10-CM

## 2015-05-19 LAB — POCT CBC
GRANULOCYTE PERCENT: 63.3 % (ref 37–80)
HEMATOCRIT: 43.7 % (ref 37.7–47.9)
Hemoglobin: 14 g/dL (ref 12.2–16.2)
Lymph, poc: 1.1 (ref 0.6–3.4)
MCH, POC: 29.8 pg (ref 27–31.2)
MCHC: 32 g/dL (ref 31.8–35.4)
MCV: 92.9 fL (ref 80–97)
MPV: 7 fL (ref 0–99.8)
POC GRANULOCYTE: 2.4 (ref 2–6.9)
POC LYMPH %: 29.4 % (ref 10–50)
Platelet Count, POC: 193 10*3/uL (ref 142–424)
RBC: 4.7 M/uL (ref 4.04–5.48)
RDW, POC: 15 %
WBC: 3.8 10*3/uL — AB (ref 4.6–10.2)

## 2015-05-19 NOTE — Progress Notes (Signed)
Subjective:    Patient ID: Courtney Frank, female    DOB: 11-12-1914, 81 y.o.   MRN: [id number]  HPI Pt here for follow up and management of chronic medical problems which includes hyperlipidemia. She is taking medications regularly. She is accompanied today by her niece. The patient complains of some joint pain in the right knee and has occasional swelling in the right lower leg. The patient is alert and pleasant and responds appropriately to questions asked of her. She denies chest pain shortness of breath only occasional heartburn and no problems passing her water. She has not seen any blood in the stool. She does like to eat salt.       Patient Active Problem List   Diagnosis Date Noted  . Retinal hemorrhage noted on examination, right 09/07/2014  . Osteoporosis 03/09/2014  . Vitamin D deficiency 11/16/2013  . Nocturnal leg cramps 11/16/2013  . Insomnia 11/16/2013  . Urine frequency 08/04/2013  . Need for prophylactic vaccination against Streptococcus pneumoniae (pneumococcus) 08/04/2013  . History of GI bleed   . Hyperlipidemia   . Incontinence   . Back pain   . Cataracts, bilateral   . Inguinal hernia    Outpatient Encounter Prescriptions as of 05/19/2015  Medication Sig  . acetaminophen (TYLENOL) 325 MG tablet Take 650 mg by mouth every 6 (six) hours as needed.    Marland Kitchen atorvastatin (LIPITOR) 20 MG tablet Take 1 tablet (20 mg total) by mouth daily.  Marland Kitchen BETIMOL 0.5 % ophthalmic solution   . calcium carbonate (OS-CAL) 600 MG TABS Take 600 mg by mouth 2 (two) times daily with a meal.    . Cholecalciferol (VITAMIN D3) 1000 UNITS CAPS Take 1 capsule by mouth daily.    Marland Kitchen omeprazole (PRILOSEC) 40 MG capsule TAKE ONE CAPSULE BY MOUTH IN THE MORNING ONE  HOUR  BEFORE  MEAL  . oxybutynin (DITROPAN-XL) 10 MG 24 hr tablet TAKE ONE TABLET BY MOUTH ONCE DAILY  . raloxifene (EVISTA) 60 MG tablet TAKE ONE TABLET BY MOUTH EVERY DAY  . [DISCONTINUED] lidocaine (LIDODERM) 5 % Place 1 patch onto  the skin daily. Remove & Discard patch within 12 hours or as directed by MD   . [DISCONTINUED] oxybutynin (DITROPAN-XL) 10 MG 24 hr tablet TAKE ONE TABLET BY MOUTH ONCE DAILY   No facility-administered encounter medications on file as of 05/19/2015.     Review of Systems  Constitutional: Negative.   HENT: Negative.   Eyes: Negative.   Respiratory: Negative.   Cardiovascular: Positive for leg swelling (right ).  Gastrointestinal: Negative.   Endocrine: Negative.   Genitourinary: Negative.   Musculoskeletal: Positive for arthralgias (right knee and leg pain and swelling).  Skin: Negative.   Allergic/Immunologic: Negative.   Neurological: Negative.   Hematological: Negative.   Psychiatric/Behavioral: Negative.        Objective:   Physical Exam  Constitutional: She is oriented to person, place, and time. She appears well-developed and well-nourished. No distress.  The patient is elderly but much younger looking than her stated age of 39 years. She is alert and responding appropriately to questions asked of her.  HENT:  Head: Normocephalic and atraumatic.  Right Ear: External ear normal.  Left Ear: External ear normal.  Nose: Nose normal.  Mouth/Throat: Oropharynx is clear and moist.  Eyes: Conjunctivae and EOM are normal. Pupils are equal, round, and reactive to light. Right eye exhibits no discharge. Left eye exhibits no discharge. No scleral icterus.  She is currently being treated  for a retinal hemorrhage in the right eye at [facility]  Neck: Normal range of motion. Neck supple. No thyromegaly present.  No bruits thyromegaly or anterior cervical adenopathy  Cardiovascular: Normal rate, regular rhythm, normal heart sounds and intact distal pulses.   No murmur heard. The heart has a regular rate and rhythm at 72/m  Pulmonary/Chest: Effort normal and breath sounds normal. No respiratory distress. She has no wheezes. She has no rales. She exhibits no tenderness.   Clear anteriorly and posteriorly  Abdominal: Soft. Bowel sounds are normal. She exhibits no mass. There is no tenderness. There is no rebound and no guarding.  No abdominal tenderness or masses or organ enlargement  Musculoskeletal: Normal range of motion. She exhibits no edema or tenderness.  There is some swelling and tenderness in the right knee compared to the left knee. She is tender at both joint lines of the right knee.  Lymphadenopathy:    She has no cervical adenopathy.  Neurological: She is alert and oriented to person, place, and time. She has normal reflexes. No cranial nerve deficit.  Skin: Skin is warm and dry. No rash noted.  Psychiatric: She has a normal mood and affect. Her behavior is normal. Judgment and thought content normal.  Nursing note and vitals reviewed.  BP 118/77 mmHg  Pulse 92  Temp(Src) 97.5 F (36.4 C) (Oral)  Ht _0  (1.549 m)  Wt 127 lb (57.607 kg)  BMI 24.01 kg/m2  [facility] reading (PRIMARY) by  Dr. Governor Rooks knee films--  degenerative changes                                     Assessment & Plan:   1. Vitamin D deficiency -Continue current treatment pending results of lab work - POCT CBC - Vit D  25 hydroxy (rtn osteoporosis monitoring)  2. Hyperlipidemia -Continue current treatment pending results of lab work - POCT CBC - BMP8+EGFR - Hepatic function panel - Lipid panel  3. Right knee pain -Take Tylenol if needed for pain, if the pain does not resolve in the next 1-2 weeks, we will arrange for patient have a visit with orthopedic surgeon here for an injection - DG Knee 1-2 Views Right; Future  4. Retinal hemorrhage noted on examination, right -Continue follow-up with ophthalmology  Patient Instructions                       Medicare Annual Wellness Visit  [place] and the medical providers at [place] strive to bring you the best medical care.  In doing so we not only want to address your current  medical conditions and concerns but also to detect new conditions early and prevent illness, disease and health-related problems.    Medicare offers a yearly Wellness Visit which allows our clinical staff to assess your need for preventative services including immunizations, lifestyle education, counseling to decrease risk of preventable diseases and screening for fall risk and other medical concerns.    This visit is provided free of charge (no copay) for all Medicare recipients. The clinical pharmacists at [facility] have begun to conduct these Wellness Visits which will also include a thorough review of all your medications.    As you primary medical provider recommend that you make an appointment for your Annual Wellness Visit if you have not done so already this  year.  You may set up this appointment before you leave today or you may call back [phone]) and schedule an appointment.  Please make sure when you call that you mention that you are scheduling your Annual Wellness Visit with the clinical pharmacist so that the appointment may be made for the proper length of time.     Continue current medications. Continue good therapeutic lifestyle changes which include good diet and exercise. Fall precautions discussed with patient. If an FOBT was given today- please return it to our front desk. If you are over 67 years old - you may need Prevnar 44 or the adult Pneumonia vaccine.  Flu Shots are still available at our office. If you still haven't had one please call to set up a nurse visit to get one.   After your visit with [place] today you will receive a survey in the mail or online from [employer] regarding your care with [place]. Please take a moment to fill this out. Your feedback is very important to [place] as you can help [place] better understand your patient needs as well as improve your experience and satisfaction. WE CARE ABOUT YOU!!!   Continue to be careful and did not put  yourself at risk for falling Try to decrease her sodium intake We will call you with the x-ray results of the right knee as soon as they become available You may take an extra Tylenol if needed for pain in the knee   Arrie Senate MD

## 2015-05-19 NOTE — Patient Instructions (Addendum)
Medicare Annual Wellness Visit  [place] and the medical providers at [facility] strive to bring you the best medical care.  In doing so we not only want to address your current medical conditions and concerns but also to detect new conditions early and prevent illness, disease and health-related problems.    Medicare offers a yearly Wellness Visit which allows our clinical staff to assess your need for preventative services including immunizations, lifestyle education, counseling to decrease risk of preventable diseases and screening for fall risk and other medical concerns.    This visit is provided free of charge (no copay) for all Medicare recipients. The clinical pharmacists at [facility] have begun to conduct these Wellness Visits which will also include a thorough review of all your medications.    As you primary medical provider recommend that you make an appointment for your Annual Wellness Visit if you have not done so already this year.  You may set up this appointment before you leave today or you may call back [phone] and schedule an appointment.  Please make sure when you call that you mention that you are scheduling your Annual Wellness Visit with the clinical pharmacist so that the appointment may be made for the proper length of time.     Continue current medications. Continue good therapeutic lifestyle changes which include good diet and exercise. Fall precautions discussed with patient. If an FOBT was given today- please return it to our front desk. If you are over 587 years old - you may need Prevnar 13 or the adult Pneumonia vaccine.  Flu Shots are still available at our office. If you still haven't had one please call to set up a nurse visit to get one.   After your visit with us today you will receive a survey in the mail or online from [employer] regarding your care with us. Please take a moment to  fill this out. Your feedback is very important to us as you can help us better understand your patient needs as well as improve your experience and satisfaction. WE CARE ABOUT YOU!!!   Continue to be careful and did not put yourself at risk for falling Try to decrease her sodium intake We will call you with the x-ray results of the right knee as soon as they become available You may take an extra Tylenol if needed for pain in the knee

## 2015-05-20 LAB — LIPID PANEL
Chol/HDL Ratio: 4.8 ratio units — ABNORMAL HIGH (ref 0.0–4.4)
Cholesterol, Total: 283 mg/dL — ABNORMAL HIGH (ref 100–199)
HDL: 59 mg/dL (ref >39)
LDL Calculated: 192 mg/dL — ABNORMAL HIGH (ref 0–99)
Triglycerides: 158 mg/dL — ABNORMAL HIGH (ref 0–149)
VLDL Cholesterol Cal: 32 mg/dL (ref 5–40)

## 2015-05-20 LAB — BMP8+EGFR
BUN/Creatinine Ratio: 23 (ref 11–26)
BUN: 23 mg/dL (ref 10–36)
CALCIUM: 9.3 mg/dL (ref 8.7–10.3)
CO2: 23 mmol/L (ref 18–29)
CREATININE: 1.02 mg/dL — AB (ref 0.57–1.00)
Chloride: 103 mmol/L (ref 97–108)
GFR calc Af Amer: 53 mL/min/{1.73_m2} — ABNORMAL LOW (ref >59)
GFR, EST NON AFRICAN AMERICAN: 46 mL/min/{1.73_m2} — AB (ref >59)
Glucose: 96 mg/dL (ref 65–99)
Potassium: 4.5 mmol/L (ref 3.5–5.2)
Sodium: 141 mmol/L (ref 134–144)

## 2015-05-20 LAB — HEPATIC FUNCTION PANEL
ALK PHOS: 82 IU/L (ref 39–117)
ALT: 9 IU/L (ref 0–32)
AST: 19 IU/L (ref 0–40)
Albumin: 3.6 g/dL (ref 3.2–4.6)
Bilirubin Total: 0.2 mg/dL (ref 0.0–1.2)
Bilirubin, Direct: 0.07 mg/dL (ref 0.00–0.40)
Total Protein: 6.3 g/dL (ref 6.0–8.5)

## 2015-05-20 LAB — VITAMIN D 25 HYDROXY (VIT D DEFICIENCY, FRACTURES): Vit D, 25-Hydroxy: 31.9 ng/mL (ref 30.0–100.0)

## 2015-05-26 DIAGNOSIS — H349 Unspecified retinal vascular occlusion: Secondary | ICD-10-CM | POA: Diagnosis not present

## 2015-05-26 DIAGNOSIS — H34811 Central retinal vein occlusion, right eye: Secondary | ICD-10-CM | POA: Diagnosis not present

## 2015-05-26 DIAGNOSIS — H35373 Puckering of macula, bilateral: Secondary | ICD-10-CM | POA: Diagnosis not present

## 2015-05-30 DIAGNOSIS — F0390 Unspecified dementia without behavioral disturbance: Secondary | ICD-10-CM | POA: Diagnosis not present

## 2015-06-09 ENCOUNTER — Telehealth: Payer: Self-pay | Admitting: Family Medicine

## 2015-06-09 NOTE — Telephone Encounter (Signed)
Urinary incontinence for "a while." Son was just made aware by her caregiver. Requesting appointment with her PCP, Dr Christell Constant. Appt scheduled for Wed 8/3 at 12:00. Her son is aware.

## 2015-06-14 ENCOUNTER — Other Ambulatory Visit: Payer: Self-pay | Admitting: *Deleted

## 2015-06-14 ENCOUNTER — Encounter: Payer: Self-pay | Admitting: Family Medicine

## 2015-06-14 ENCOUNTER — Ambulatory Visit (INDEPENDENT_AMBULATORY_CARE_PROVIDER_SITE_OTHER): Payer: Medicare Other | Admitting: Family Medicine

## 2015-06-14 VITALS — BP 140/88 | HR 74 | Temp 97.0°F | Ht 61.0 in | Wt 124.6 lb

## 2015-06-14 DIAGNOSIS — R32 Unspecified urinary incontinence: Secondary | ICD-10-CM | POA: Diagnosis not present

## 2015-06-14 DIAGNOSIS — K921 Melena: Secondary | ICD-10-CM | POA: Diagnosis not present

## 2015-06-14 DIAGNOSIS — M25561 Pain in right knee: Secondary | ICD-10-CM | POA: Diagnosis not present

## 2015-06-14 LAB — POCT URINALYSIS DIPSTICK
BILIRUBIN UA: NEGATIVE
Glucose, UA: NEGATIVE
KETONES UA: NEGATIVE
NITRITE UA: POSITIVE
Spec Grav, UA: 1.015
Urobilinogen, UA: NEGATIVE
pH, UA: 6

## 2015-06-14 LAB — POCT UA - MICROSCOPIC ONLY
Casts, Ur, LPF, POC: NEGATIVE
Crystals, Ur, HPF, POC: NEGATIVE
Epithelial cells, urine per micros: NEGATIVE
Mucus, UA: NEGATIVE
Yeast, UA: NEGATIVE

## 2015-06-14 MED ORDER — SULFAMETHOXAZOLE-TRIMETHOPRIM 800-160 MG PO TABS: 1.0000 | ORAL_TABLET | Freq: Two times a day (BID) | ORAL | Status: DC

## 2015-06-14 NOTE — Progress Notes (Signed)
Subjective:    Patient ID: DEEANNE Frank, female    DOB: September 25, 1915, 81 y.o.   MRN: [id number]  HPI  Patient is here to discuss right knee pain and swelling, urinary incontinence. The patient had x-rays of her right knee about 1 month ago which showed mild degenerative changes. These results were reviewed with the patient and her caregiver and she was given a copy of the report. She has a history of urinary incontinence. The patient denies any burning with passing her water.       Patient Active Problem List   Diagnosis Date Noted  . Retinal hemorrhage noted on examination, right 09/07/2014  . Osteoporosis 03/09/2014  . Vitamin D deficiency 11/16/2013  . Nocturnal leg cramps 11/16/2013  . Insomnia 11/16/2013  . Urine frequency 08/04/2013  . Need for prophylactic vaccination against Streptococcus pneumoniae (pneumococcus) 08/04/2013  . History of GI bleed   . Hyperlipidemia   . Incontinence   . Back pain   . Cataracts, bilateral   . Inguinal hernia    Outpatient Encounter Prescriptions as of 06/14/2015  Medication Sig  . acetaminophen (TYLENOL) 325 MG tablet Take 650 mg by mouth every 6 (six) hours as needed.    Marland Kitchen atorvastatin (LIPITOR) 20 MG tablet Take 1 tablet (20 mg total) by mouth daily.  Marland Kitchen BETIMOL 0.5 % ophthalmic solution   . calcium carbonate (OS-CAL) 600 MG TABS Take 600 mg by mouth 2 (two) times daily with a meal.    . Cholecalciferol (VITAMIN D3) 1000 UNITS CAPS Take 1 capsule by mouth daily.    Marland Kitchen omeprazole (PRILOSEC) 40 MG capsule TAKE ONE CAPSULE BY MOUTH IN THE MORNING ONE  HOUR  BEFORE  MEAL  . oxybutynin (DITROPAN-XL) 10 MG 24 hr tablet TAKE ONE TABLET BY MOUTH ONCE DAILY  . raloxifene (EVISTA) 60 MG tablet TAKE ONE TABLET BY MOUTH EVERY DAY   No facility-administered encounter medications on file as of 06/14/2015.      Review of Systems  Constitutional: Negative.   HENT: Negative.   Eyes: Negative.   Respiratory: Negative.   Cardiovascular: Negative.    Gastrointestinal: Negative.   Endocrine: Negative.   Genitourinary: Negative.        Patient experiencing urinary incontinence  Musculoskeletal: Positive for joint swelling (right knee).  Skin: Negative.   Allergic/Immunologic: Negative.   Neurological: Negative.   Hematological: Negative.   Psychiatric/Behavioral: Negative.        Objective:   Physical Exam  Constitutional: She is oriented to person, place, and time. She appears well-developed and well-nourished. No distress.  The patient is doing excellent for her age of 81 years. She is alert and understands all of our conversation.  HENT:  Head: Normocephalic and atraumatic.  Eyes: Conjunctivae and EOM are normal. Right eye exhibits no discharge. Left eye exhibits no discharge. No scleral icterus.  Abdominal: Soft. She exhibits no distension. There is tenderness. There is no rebound and no guarding.  Minimal suprapubic tenderness  Musculoskeletal: Normal range of motion. She exhibits tenderness. She exhibits no edema.  Minimal bilateral joint line tenderness of the right knee with slight swelling  Neurological: She is alert and oriented to person, place, and time.  Skin: Skin is warm and dry. No rash noted.  Psychiatric: She has a normal mood and affect. Her behavior is normal. Judgment and thought content normal.  Nursing note and vitals reviewed.   BP 140/88 mmHg  Pulse 74  Temp(Src) 97 F (36.1 C)  Ht 5'  1" (1.549 m)  Wt 124 lb 9.6 oz (56.518 kg)  BMI 23.56 kg/m2       Assessment & Plan:  1. Right knee pain -The patient has had previous x-rays and cannot take anti-inflammatory medicines. These x-rays reveal arthritic changes in the right knee. We will arrange for her to see the orthopedic specialist when he comes to the office later this month.  2. Gastrointestinal hemorrhage with melena -Only take Tylenol as needed for pain  3. Incontinence -This incontinence has been going on for a good while and the  patient is not having any burning or stinging when she passes her water. There was concern from her family that we need to do something about it but the caregiver indicates that the incontinence is no worse than usual and the day that she had incontinence when family members were present she was not wearing a pad. She has had issues with medication in the past for this. We will check a urine today and unless there is a significant amount of infection we will just continue with having her wear the pad and drink plenty of fluids  Patient Instructions  Because of the continued complaints with the right knee and no complaints with the left knee we will arrange for her to see the orthopedic surgeon for an injection in about a couple weeks when he makes his next visit to the office. She should continue to take Tylenol as needed for knee pain She should continue to drink plenty of liquids and wear pads for control of urinary incontinence as she has had problems with medications in the past for this problem.   Nyra Capes MD

## 2015-06-14 NOTE — Patient Instructions (Signed)
Because of the continued complaints with the right knee and no complaints with the left knee we will arrange for her to see the orthopedic surgeon for an injection in about a couple weeks when he makes his next visit to the office. She should continue to take Tylenol as needed for knee pain She should continue to drink plenty of liquids and wear pads for control of urinary incontinence as she has had problems with medications in the past for this problem.

## 2015-06-15 ENCOUNTER — Telehealth: Payer: Self-pay | Admitting: Family Medicine

## 2015-06-15 NOTE — Telephone Encounter (Signed)
Pharm changed in epic

## 2015-06-16 ENCOUNTER — Telehealth: Payer: Self-pay | Admitting: Family Medicine

## 2015-06-16 LAB — URINE CULTURE

## 2015-06-20 ENCOUNTER — Other Ambulatory Visit: Payer: Medicare Other

## 2015-06-20 DIAGNOSIS — N390 Urinary tract infection, site not specified: Secondary | ICD-10-CM

## 2015-06-20 NOTE — Progress Notes (Signed)
Lab only 

## 2015-06-22 LAB — URINE CULTURE: Organism ID, Bacteria: NO GROWTH

## 2015-07-06 ENCOUNTER — Ambulatory Visit: Payer: Medicare Other

## 2015-07-06 DIAGNOSIS — M1711 Unilateral primary osteoarthritis, right knee: Secondary | ICD-10-CM | POA: Diagnosis not present

## 2015-07-20 DIAGNOSIS — I70203 Unspecified atherosclerosis of native arteries of extremities, bilateral legs: Secondary | ICD-10-CM | POA: Diagnosis not present

## 2015-07-28 DIAGNOSIS — H5461 Unqualified visual loss, right eye, normal vision left eye: Secondary | ICD-10-CM | POA: Diagnosis not present

## 2015-07-28 DIAGNOSIS — H35373 Puckering of macula, bilateral: Secondary | ICD-10-CM | POA: Diagnosis not present

## 2015-07-28 DIAGNOSIS — Z818 Family history of other mental and behavioral disorders: Secondary | ICD-10-CM | POA: Diagnosis not present

## 2015-07-28 DIAGNOSIS — H34811 Central retinal vein occlusion, right eye: Secondary | ICD-10-CM | POA: Diagnosis not present

## 2015-07-28 DIAGNOSIS — F0390 Unspecified dementia without behavioral disturbance: Secondary | ICD-10-CM | POA: Diagnosis not present

## 2015-07-28 DIAGNOSIS — Z79899 Other long term (current) drug therapy: Secondary | ICD-10-CM | POA: Diagnosis not present

## 2015-07-28 DIAGNOSIS — Z888 Allergy status to other drugs, medicaments and biological substances status: Secondary | ICD-10-CM | POA: Diagnosis not present

## 2015-09-03 IMAGING — CR DG KNEE 1-2V*R*
2 series · 2 of 2 positions shown · non-contrast
Comparison: None.

CLINICAL DATA: Right knee pain and swelling.  No known injury.

EXAM:
RIGHT KNEE - 1-2 VIEW

[view not recorded (1 of 2)]
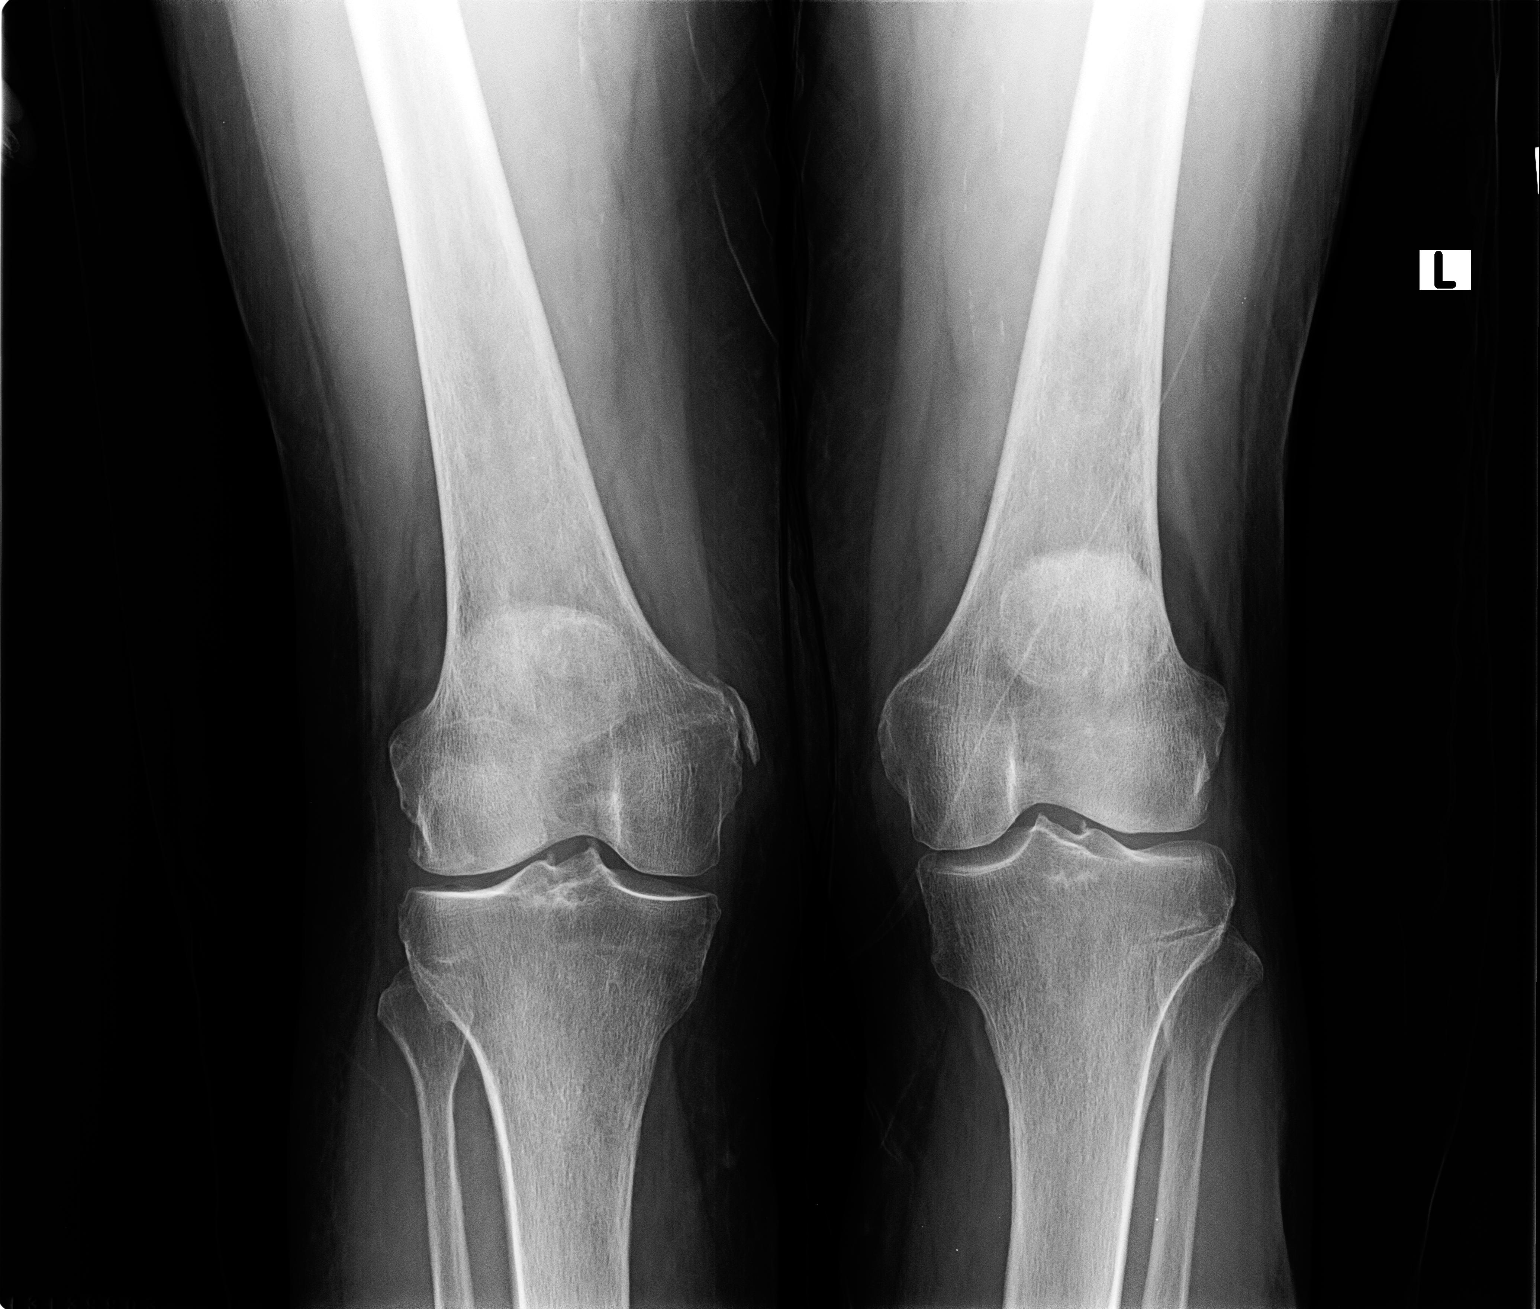

[view not recorded (2 of 2)]
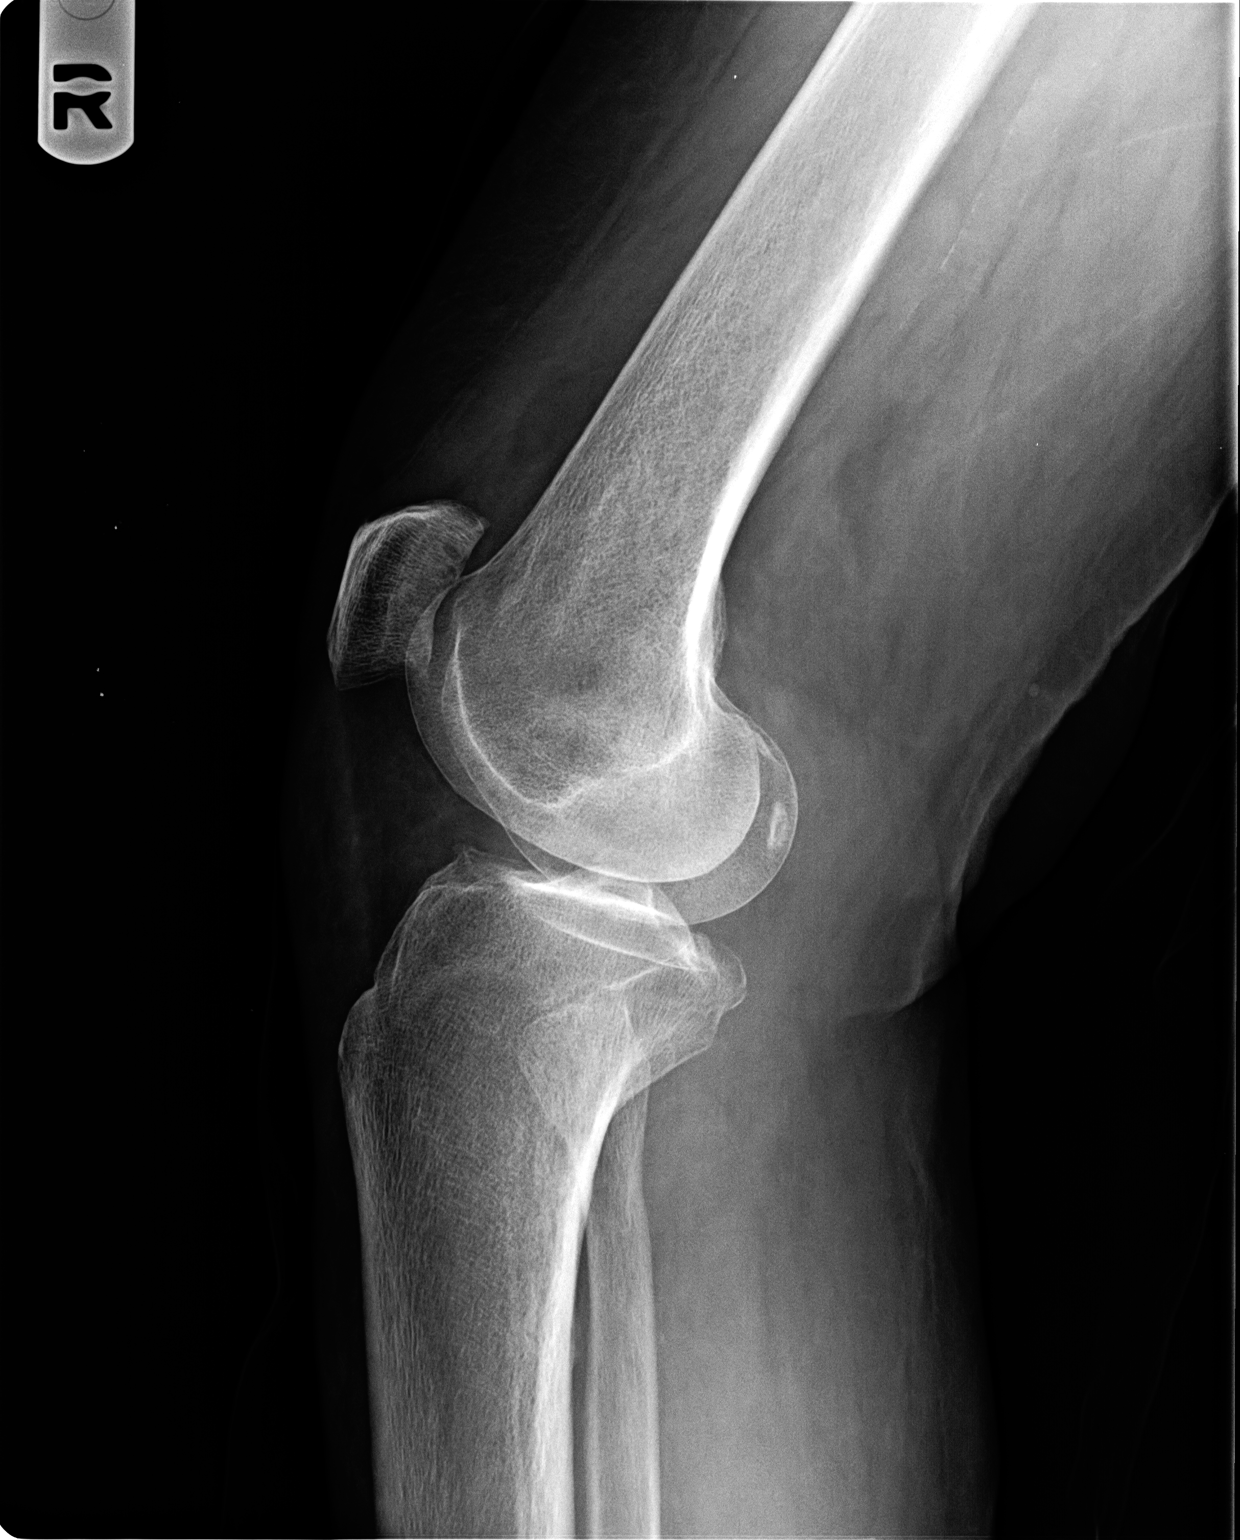

[2 of 2 positions shown; findings below may reference images not displayed]

FINDINGS: There is no evidence of fracture, dislocation, or joint effusion.

A well corticated ossific density is seen adjacent to the medial
femoral condyles, consistent with old medial collateral ligament
injury. Mild degenerative spurring seen involving the patella. No
evidence of joint space narrowing. No other significant bone
abnormality identified.
IMPRESSION: No acute findings.

Mild patellar degenerative spurring and old medial collateral
ligament injury.

## 2015-09-11 ENCOUNTER — Other Ambulatory Visit: Payer: Self-pay | Admitting: Family Medicine

## 2015-09-26 ENCOUNTER — Ambulatory Visit: Payer: Medicare Other | Admitting: Family Medicine

## 2015-10-06 ENCOUNTER — Ambulatory Visit: Payer: Medicare Other | Admitting: Family Medicine

## 2015-10-25 ENCOUNTER — Encounter: Payer: Self-pay | Admitting: Family Medicine

## 2015-10-25 ENCOUNTER — Ambulatory Visit (INDEPENDENT_AMBULATORY_CARE_PROVIDER_SITE_OTHER): Payer: Medicare Other | Admitting: Family Medicine

## 2015-10-25 ENCOUNTER — Ambulatory Visit (INDEPENDENT_AMBULATORY_CARE_PROVIDER_SITE_OTHER): Payer: Medicare Other

## 2015-10-25 VITALS — BP 131/75 | HR 68 | Temp 96.9°F | Ht 61.0 in | Wt 122.0 lb

## 2015-10-25 DIAGNOSIS — Z0000 Encounter for general adult medical examination without abnormal findings: Secondary | ICD-10-CM

## 2015-10-25 DIAGNOSIS — M810 Age-related osteoporosis without current pathological fracture: Secondary | ICD-10-CM

## 2015-10-25 DIAGNOSIS — Z23 Encounter for immunization: Secondary | ICD-10-CM

## 2015-10-25 DIAGNOSIS — E785 Hyperlipidemia, unspecified: Secondary | ICD-10-CM | POA: Diagnosis not present

## 2015-10-25 DIAGNOSIS — E559 Vitamin D deficiency, unspecified: Secondary | ICD-10-CM

## 2015-10-25 NOTE — Progress Notes (Signed)
Subjective:    Patient ID: Courtney Frank, female    DOB: May 27, 1915, 81 y.o.   MRN: [id number]  HPI Pt here for follow up and management of chronic medical problems which includes hyperlipidemia. She is taking medications regularly. The patient continues to have some knee pain in her right knee. The patient's weight is slightly decreased from the previous visit and her BMI is 23. She says sometimes she has to be reminded to eat. She is due to return an FOBT get a chest x-ray lab work and get her flu shot. The patient is alert for her age and looks much younger than her stated age. She does take Tylenol PM and the reason for discontinuing this was discussed with her and her caregiver today. She does complain of some arthritis in her knee and currently plain Tylenol is taking care of this. They'll also try some warm wet compresses to the knee with good range of motion exercises. The patient denies any chest pain or shortness of breath. She does have occasional heartburn and she takes Alka-Seltzer for this. She denies any blood in the stool or black tarry bowel movements and is passing her water without problems.      Patient Active Problem List   Diagnosis Date Noted  . Retinal hemorrhage noted on examination, right 09/07/2014  . Osteoporosis 03/09/2014  . Vitamin D deficiency 11/16/2013  . Nocturnal leg cramps 11/16/2013  . Insomnia 11/16/2013  . Urine frequency 08/04/2013  . Need for prophylactic vaccination against Streptococcus pneumoniae (pneumococcus) 08/04/2013  . History of GI bleed   . Hyperlipidemia   . Incontinence   . Back pain   . Cataracts, bilateral   . Inguinal hernia    Outpatient Encounter Prescriptions as of 10/25/2015  Medication Sig  . acetaminophen (TYLENOL) 325 MG tablet Take 650 mg by mouth every 6 (six) hours as needed.    Marland Kitchen atorvastatin (LIPITOR) 20 MG tablet Take 1 tablet (20 mg total) by mouth daily.  Marland Kitchen BETIMOL 0.5 % ophthalmic solution   . calcium  carbonate (OS-CAL) 600 MG TABS Take 600 mg by mouth 2 (two) times daily with a meal.    . Cholecalciferol (VITAMIN D3) 1000 UNITS CAPS Take 1 capsule by mouth daily.    Marland Kitchen omeprazole (PRILOSEC) 40 MG capsule TAKE ONE CAPSULE BY MOUTH IN THE MORNING ONE  HOUR  BEFORE  MEAL  . oxybutynin (DITROPAN-XL) 10 MG 24 hr tablet TAKE ONE TABLET BY MOUTH ONCE DAILY  . raloxifene (EVISTA) 60 MG tablet TAKE ONE TABLET BY MOUTH EVERY DAY  . [DISCONTINUED] sulfamethoxazole-trimethoprim (BACTRIM DS,SEPTRA DS) 800-160 MG per tablet Take 1 tablet by mouth 2 (two) times daily.   No facility-administered encounter medications on file as of 10/25/2015.      Review of Systems  Constitutional: Positive for appetite change (has to be reminded to eat.).  HENT: Negative.   Eyes: Negative.   Respiratory: Negative.   Cardiovascular: Negative.   Gastrointestinal: Negative.   Endocrine: Negative.   Genitourinary: Negative.   Musculoskeletal: Positive for arthralgias (cont . right knee pain ).  Skin: Negative.   Allergic/Immunologic: Negative.   Neurological: Negative.   Hematological: Negative.   Psychiatric/Behavioral: Negative.        Objective:   Physical Exam  Constitutional: She is oriented to person, place, and time. She appears well-developed and well-nourished. No distress.  Small framed and excellent appearance for being 81 years old. The patient is alert and responds to questions appropriately  HENT:  Head: Normocephalic and atraumatic.  Right Ear: External ear normal.  Left Ear: External ear normal.  Nose: Nose normal.  Mouth/Throat: Oropharynx is clear and moist.  Eyes: Conjunctivae and EOM are normal. Pupils are equal, round, and reactive to light. Right eye exhibits no discharge. Left eye exhibits no discharge. No scleral icterus.  Neck: Normal range of motion. Neck supple. No thyromegaly present.  Without bruits or thyromegaly  Cardiovascular: Normal rate, regular rhythm, normal heart sounds  and intact distal pulses.   No murmur heard. Heart had a regular rate and rhythm at 60/m  Pulmonary/Chest: Effort normal and breath sounds normal. No respiratory distress. She has no wheezes. She has no rales. She exhibits no tenderness.  Clear anteriorly and posteriorly  Abdominal: Soft. Bowel sounds are normal. She exhibits no mass. There is no tenderness. There is no rebound and no guarding.  No abdominal tenderness masses or adenopathy  Musculoskeletal: Normal range of motion. She exhibits no edema or tenderness.  Stiffness in the right knee but fairly good mobility during the visit.  Lymphadenopathy:    She has no cervical adenopathy.  Neurological: She is alert and oriented to person, place, and time.  Skin: Skin is warm and dry. No rash noted.  Psychiatric: She has a normal mood and affect. Her behavior is normal. Judgment and thought content normal.  Nursing note and vitals reviewed.  BP 131/75 mmHg  Pulse 68  Temp(Src) 96.9 F (36.1 C) (Oral)  Ht 5' 1"  (1.549 m)  Wt 122 lb (55.339 kg)  BMI 23.06 kg/m2        Assessment & Plan:  1. Vitamin D deficiency -Continue current treatment pending results of lab work and continue to be careful and do not put yourself at risk for falling - CBC with Differential/Platelet - VITAMIN D 25 Hydroxy (Vit-D Deficiency, Fractures) - DG Chest 2 View; Future  2. Hyperlipidemia -Continue atorvastatin and healthy diet habits - BMP8+EGFR - CBC with Differential/Platelet - Hepatic function panel - Lipid panel - DG Chest 2 View; Future  3. Health care maintenance -Try a boost once daily for weight maintenance - DG Chest 2 View; Future  4. Osteoporosis -Continue to be careful and did not put herself at risk for falling  5. Encounter for immunization -Flu shot given today  Patient Instructions                       Medicare Annual Wellness Visit  [facility] and the medical providers at [facility] strive to  bring you the best medical care.  In doing so we not only want to address your current medical conditions and concerns but also to detect new conditions early and prevent illness, disease and health-related problems.    Medicare offers a yearly Wellness Visit which allows our clinical staff to assess your need for preventative services including immunizations, lifestyle education, counseling to decrease risk of preventable diseases and screening for fall risk and other medical concerns.    This visit is provided free of charge (no copay) for all Medicare recipients. The clinical pharmacists at [facility] have begun to conduct these Wellness Visits which will also include a thorough review of all your medications.    As you primary medical provider recommend that you make an appointment for your Annual Wellness Visit if you have not done so already this year.  You may set up this appointment before you leave today or you  may call back ([phone]) and schedule an appointment.  Please make sure when you call that you mention that you are scheduling your Annual Wellness Visit with the clinical pharmacist so that the appointment may be made for the proper length of time.     Continue current medications. Continue good therapeutic lifestyle changes which include good diet and exercise. Fall precautions discussed with patient. If an FOBT was given today- please return it to our front desk. If you are over 51 years old - you may need Prevnar 71 or the adult Pneumonia vaccine.  **Flu shots are available--- please call and schedule a FLU-CLINIC appointment**  After your visit with [place] today you will receive a survey in the mail or online from [employer] regarding your care with [place]. Please take a moment to fill this out. Your feedback is very important to [place] as you can help [place] better understand your patient needs as well as improve your experience and satisfaction. WE CARE ABOUT YOU!!!     The patient will try a boost once a day to help maintain her weight She will continue to be careful and not put herself at risk for falling She will stop the Tylenol PM because of the correlation of this with dementia and elderly people She will take a Tylenol arthritis at bedtime She'll use warm wet compresses on her knee and at the knee gives her more problems she will call and make an appointment to see the orthopedic surgeon that comes to our office for another injection She'll drink plenty of fluids and stay well hydrated and keep the house as cool as possible   Arrie Senate MD

## 2015-10-25 NOTE — Patient Instructions (Addendum)
Medicare Annual Wellness Visit  [facility] [place] and the medical providers at [facility] strive to bring you the best medical care.  In doing so we not only want to address your current medical conditions and concerns but also to detect new conditions early and prevent illness, disease and health-related problems.    Medicare offers a yearly Wellness Visit which allows our clinical staff to assess your need for preventative services including immunizations, lifestyle education, counseling to decrease risk of preventable diseases and screening for fall risk and other medical concerns.    This visit is provided free of charge (no copay) for all Medicare recipients. The clinical pharmacists at [facility] have begun to conduct these Wellness Visits which will also include a thorough review of all your medications.    As you primary medical provider recommend that you make an appointment for your Annual Wellness Visit if you have not done so already this year.  You may set up this appointment before you leave today or you may call back [phone] and schedule an appointment.  Please make sure when you call that you mention that you are scheduling your Annual Wellness Visit with the clinical pharmacist so that the appointment may be made for the proper length of time.     Continue current medications. Continue good therapeutic lifestyle changes which include good diet and exercise. Fall precautions discussed with patient. If an FOBT was given today- please return it to our front desk. If you are over 79 years old - you may need Prevnar 13 or the adult Pneumonia vaccine.  **Flu shots are available--- please call and schedule a FLU-CLINIC appointment**  After your visit with us today you will receive a survey in the mail or online from [employer] regarding your care with us. Please take a moment to fill this out. Your feedback is very  important to us as you can help us better understand your patient needs as well as improve your experience and satisfaction. WE CARE ABOUT YOU!!!   The patient will try a boost once a day to help maintain her weight She will continue to be careful and not put herself at risk for falling She will stop the Tylenol PM because of the correlation of this with dementia and elderly people She will take a Tylenol arthritis at bedtime She'll use warm wet compresses on her knee and at the knee gives her more problems she will call and make an appointment to see the orthopedic surgeon that comes to our office for another injection She'll drink plenty of fluids and stay well hydrated and keep the house as cool as possible

## 2015-10-26 LAB — HEPATIC FUNCTION PANEL
ALBUMIN: 3.9 g/dL (ref 3.2–4.6)
ALT: 7 IU/L (ref 0–32)
AST: 14 IU/L (ref 0–40)
Alkaline Phosphatase: 90 IU/L (ref 39–117)
BILIRUBIN TOTAL: 0.3 mg/dL (ref 0.0–1.2)
Bilirubin, Direct: 0.11 mg/dL (ref 0.00–0.40)
TOTAL PROTEIN: 6.1 g/dL (ref 6.0–8.5)

## 2015-10-26 LAB — CBC WITH DIFFERENTIAL/PLATELET
Basophils Absolute: 0 10*3/uL (ref 0.0–0.2)
Basos: 1 %
EOS (ABSOLUTE): 0 10*3/uL (ref 0.0–0.4)
EOS: 0 %
HEMATOCRIT: 44.2 % (ref 34.0–46.6)
HEMOGLOBIN: 14.5 g/dL (ref 11.1–15.9)
IMMATURE GRANULOCYTES: 0 %
Immature Grans (Abs): 0 10*3/uL (ref 0.0–0.1)
LYMPHS: 21 %
Lymphocytes Absolute: 0.8 10*3/uL (ref 0.7–3.1)
MCH: 31.5 pg (ref 26.6–33.0)
MCHC: 32.8 g/dL (ref 31.5–35.7)
MCV: 96 fL (ref 79–97)
Monocytes Absolute: 0.3 10*3/uL (ref 0.1–0.9)
Monocytes: 9 %
NEUTROS ABS: 2.5 10*3/uL (ref 1.4–7.0)
Neutrophils: 69 %
PLATELETS: 204 10*3/uL (ref 150–379)
RBC: 4.6 x10E6/uL (ref 3.77–5.28)
RDW: 13.9 % (ref 12.3–15.4)
WBC: 3.6 10*3/uL (ref 3.4–10.8)

## 2015-10-26 LAB — VITAMIN D 25 HYDROXY (VIT D DEFICIENCY, FRACTURES): VIT D 25 HYDROXY: 38.2 ng/mL (ref 30.0–100.0)

## 2015-10-26 LAB — LIPID PANEL
CHOL/HDL RATIO: 5.6 ratio — AB (ref 0.0–4.4)
Cholesterol, Total: 278 mg/dL — ABNORMAL HIGH (ref 100–199)
HDL: 50 mg/dL (ref >39)
LDL CALC: 193 mg/dL — AB (ref 0–99)
Triglycerides: 174 mg/dL — ABNORMAL HIGH (ref 0–149)
VLDL CHOLESTEROL CAL: 35 mg/dL (ref 5–40)

## 2015-10-26 LAB — BMP8+EGFR
BUN/Creatinine Ratio: 16 (ref 11–26)
BUN: 14 mg/dL (ref 10–36)
CO2: 29 mmol/L (ref 18–29)
CREATININE: 0.86 mg/dL (ref 0.57–1.00)
Calcium: 9.2 mg/dL (ref 8.7–10.3)
Chloride: 102 mmol/L (ref 96–106)
GFR calc non Af Amer: 56 mL/min/{1.73_m2} — ABNORMAL LOW (ref >59)
GFR, EST AFRICAN AMERICAN: 65 mL/min/{1.73_m2} (ref >59)
Glucose: 102 mg/dL — ABNORMAL HIGH (ref 65–99)
POTASSIUM: 4.6 mmol/L (ref 3.5–5.2)
Sodium: 144 mmol/L (ref 134–144)

## 2015-11-20 ENCOUNTER — Other Ambulatory Visit: Payer: Self-pay | Admitting: Family Medicine

## 2015-11-20 ENCOUNTER — Telehealth: Payer: Self-pay | Admitting: Family Medicine

## 2015-11-20 NOTE — Telephone Encounter (Signed)
Courtney MaplesGlenn Botting son is very concerned about patient that she is still driving and cooking. They do not feel that she should be driving and wants to know what you think the best way to handle this.

## 2015-11-20 NOTE — Telephone Encounter (Signed)
Tell the patient's son to take the keys away and tell her she is not able to be driving anymore.Period.It is dangerous for a lot of people to be driving this day and time, but especially when the patient is 82 years old.

## 2015-11-22 ENCOUNTER — Telehealth: Payer: Self-pay

## 2015-11-22 MED ORDER — SIMVASTATIN 80 MG PO TABS: 80.0000 mg | ORAL_TABLET | Freq: Every day | ORAL | Status: DC

## 2015-11-22 MED ORDER — EZETIMIBE 10 MG PO TABS: 10.0000 mg | ORAL_TABLET | Freq: Every day | ORAL | Status: DC

## 2015-11-22 NOTE — Telephone Encounter (Signed)
Insurance denied prior authorization for Vytorin   Needs to try and fail Zetia and Simvastatin

## 2015-11-22 NOTE — Telephone Encounter (Signed)
Switch to Zetia and simvastatin, individual components for the Vytorin

## 2015-11-22 NOTE — Addendum Note (Signed)
Addended by: Magdalene RiverBULLINS, Lyfe Reihl H on: 11/22/2015 02:53 PM   Modules accepted: Orders

## 2015-11-28 DIAGNOSIS — Z9841 Cataract extraction status, right eye: Secondary | ICD-10-CM | POA: Diagnosis not present

## 2015-11-28 DIAGNOSIS — Z818 Family history of other mental and behavioral disorders: Secondary | ICD-10-CM | POA: Diagnosis not present

## 2015-11-28 DIAGNOSIS — H34811 Central retinal vein occlusion, right eye, with macular edema: Secondary | ICD-10-CM | POA: Diagnosis not present

## 2015-11-28 DIAGNOSIS — Z961 Presence of intraocular lens: Secondary | ICD-10-CM | POA: Diagnosis not present

## 2015-11-28 DIAGNOSIS — Z888 Allergy status to other drugs, medicaments and biological substances status: Secondary | ICD-10-CM | POA: Diagnosis not present

## 2015-11-28 DIAGNOSIS — H35373 Puckering of macula, bilateral: Secondary | ICD-10-CM | POA: Diagnosis not present

## 2015-11-28 DIAGNOSIS — T8522XA Displacement of intraocular lens, initial encounter: Secondary | ICD-10-CM | POA: Diagnosis not present

## 2015-11-28 DIAGNOSIS — H348112 Central retinal vein occlusion, right eye, stable: Secondary | ICD-10-CM | POA: Diagnosis not present

## 2015-11-28 DIAGNOSIS — Z79899 Other long term (current) drug therapy: Secondary | ICD-10-CM | POA: Diagnosis not present

## 2015-11-28 DIAGNOSIS — H538 Other visual disturbances: Secondary | ICD-10-CM | POA: Diagnosis not present

## 2015-11-30 NOTE — Telephone Encounter (Signed)
Several attempts have been made to contact patients son this encounter will be closed.

## 2015-12-08 ENCOUNTER — Other Ambulatory Visit: Payer: Self-pay | Admitting: Family Medicine

## 2015-12-12 ENCOUNTER — Encounter: Payer: Medicare Other | Admitting: Family Medicine

## 2015-12-12 DIAGNOSIS — F0390 Unspecified dementia without behavioral disturbance: Secondary | ICD-10-CM | POA: Diagnosis not present

## 2015-12-12 DIAGNOSIS — Z888 Allergy status to other drugs, medicaments and biological substances status: Secondary | ICD-10-CM | POA: Diagnosis not present

## 2016-01-16 ENCOUNTER — Other Ambulatory Visit: Payer: Self-pay | Admitting: Family Medicine

## 2016-03-01 ENCOUNTER — Ambulatory Visit: Payer: Medicare Other | Admitting: Family Medicine

## 2016-03-04 ENCOUNTER — Encounter: Payer: Self-pay | Admitting: Family Medicine

## 2016-03-05 ENCOUNTER — Telehealth: Payer: Self-pay | Admitting: Family Medicine

## 2016-03-05 ENCOUNTER — Ambulatory Visit (INDEPENDENT_AMBULATORY_CARE_PROVIDER_SITE_OTHER): Payer: Medicare Other | Admitting: Family Medicine

## 2016-03-05 ENCOUNTER — Encounter: Payer: Self-pay | Admitting: Family Medicine

## 2016-03-05 VITALS — BP 134/82 | HR 65 | Temp 97.2°F | Ht 61.0 in | Wt 125.0 lb

## 2016-03-05 DIAGNOSIS — E559 Vitamin D deficiency, unspecified: Secondary | ICD-10-CM | POA: Diagnosis not present

## 2016-03-05 DIAGNOSIS — E785 Hyperlipidemia, unspecified: Secondary | ICD-10-CM | POA: Diagnosis not present

## 2016-03-05 DIAGNOSIS — R42 Dizziness and giddiness: Secondary | ICD-10-CM | POA: Diagnosis not present

## 2016-03-05 DIAGNOSIS — R350 Frequency of micturition: Secondary | ICD-10-CM | POA: Diagnosis not present

## 2016-03-05 LAB — URINALYSIS, COMPLETE
BILIRUBIN UA: NEGATIVE
GLUCOSE, UA: NEGATIVE
KETONES UA: NEGATIVE
Leukocytes, UA: NEGATIVE
Nitrite, UA: NEGATIVE
PROTEIN UA: NEGATIVE
Specific Gravity, UA: 1.02 (ref 1.005–1.030)
UUROB: 0.2 mg/dL (ref 0.2–1.0)
pH, UA: 6.5 (ref 5.0–7.5)

## 2016-03-05 LAB — MICROSCOPIC EXAMINATION
Epithelial Cells (non renal): NONE SEEN /hpf (ref 0–10)
WBC UA: NONE SEEN /HPF (ref 0–?)

## 2016-03-05 NOTE — Telephone Encounter (Signed)
Son called - he will be made primary / first contact for all calls.

## 2016-03-05 NOTE — Patient Instructions (Addendum)
The patient will make an effort to drink more fluids She understands that not enough fluids can cause a urinary tract infection and dehydration. The caregiver will remind her to do this but she is only there a couple hours a day. The patient was reminded to sit on the bed before standing and to stand beside the bed before walking. She is reminded to get up from the chair to stand in front of the chair for a few seconds before she starts walking.

## 2016-03-05 NOTE — Progress Notes (Signed)
Subjective:    Patient ID: Courtney Frank, female    DOB: November 04, 1915, 82 y.o.   MRN: [id number]  HPI Patient is here today for infrequent episodes of dizziness. These episodes usually happen when she is going from sitting to standing.There is occasionally some headache. She has urinary frequency also which is worse at nighttime. The patient is pleasant and alert despite her age of 82 years. She denies any chest pain or shortness of breath. She denies any shortness of breath at nighttime. She has no problems with her GI tract or nausea vomiting diarrhea or blood in the stool that she is aware of. She is passing her water without problems but does have some frequency.     Patient Active Problem List   Diagnosis Date Noted  . Retinal hemorrhage noted on examination, right 09/07/2014  . Osteoporosis 03/09/2014  . Vitamin D deficiency 11/16/2013  . Nocturnal leg cramps 11/16/2013  . Insomnia 11/16/2013  . Urine frequency 08/04/2013  . Need for prophylactic vaccination against Streptococcus pneumoniae (pneumococcus) 08/04/2013  . History of GI bleed   . Hyperlipidemia   . Incontinence   . Back pain   . Cataracts, bilateral   . Inguinal hernia    Outpatient Encounter Prescriptions as of 03/05/2016  Medication Sig  . acetaminophen (TYLENOL) 325 MG tablet Take 650 mg by mouth every 6 (six) hours as needed.    Marland Kitchen BETIMOL 0.5 % ophthalmic solution   . calcium carbonate (OS-CAL) 600 MG TABS Take 600 mg by mouth 2 (two) times daily with a meal.    . Cholecalciferol (VITAMIN D3) 1000 UNITS CAPS Take 1 capsule by mouth daily.    Marland Kitchen ezetimibe (ZETIA) 10 MG tablet Take 1 tablet (10 mg total) by mouth daily.  Marland Kitchen omeprazole (PRILOSEC) 40 MG capsule TAKE ONE CAPSULE BY MOUTH ONCE DAILY 1  HOUR  BEFORE  A  MEAL  . oxybutynin (DITROPAN-XL) 10 MG 24 hr tablet TAKE ONE TABLET BY MOUTH ONCE DAILY  . raloxifene (EVISTA) 60 MG tablet TAKE ONE TABLET BY MOUTH ONCE DAILY  . simvastatin (ZOCOR) 80 MG tablet Take  1 tablet (80 mg total) by mouth daily.  . [DISCONTINUED] oxybutynin (DITROPAN-XL) 10 MG 24 hr tablet TAKE ONE TABLET BY MOUTH ONCE DAILY   No facility-administered encounter medications on file as of 03/05/2016.      Review of Systems  Constitutional: Negative.   Eyes: Negative.   Respiratory: Negative.   Cardiovascular: Negative.   Gastrointestinal: Negative.   Endocrine: Negative.   Genitourinary: Positive for frequency.  Musculoskeletal: Negative.   Skin: Negative.   Allergic/Immunologic: Negative.   Neurological: Positive for dizziness and headaches (sometimes).  Hematological: Negative.   Psychiatric/Behavioral: Negative.        Objective:   Physical Exam  Constitutional: She is oriented to person, place, and time. She appears well-developed and well-nourished. No distress.  Small framed but alert and elderly black female who comes to the visit today with her caregiver. She wears a Lifeline around her neck.  HENT:  Head: Normocephalic and atraumatic.  Right Ear: External ear normal.  Left Ear: External ear normal.  Nose: Nose normal.  Mouth/Throat: Oropharynx is clear and moist.  Eyes: Conjunctivae and EOM are normal. Pupils are equal, round, and reactive to light. Right eye exhibits no discharge. Left eye exhibits no discharge. No scleral icterus.  She has diminished vision but has had her eyes checked recently.  Neck: Normal range of motion. Neck supple. No thyromegaly present.  No thyromegaly anterior cervical adenopathy or bruits  Cardiovascular: Normal rate and regular rhythm.   No murmur heard. Heart has a regular rate and rhythm at 72/m  Pulmonary/Chest: Effort normal and breath sounds normal. No respiratory distress. She has no wheezes. She has no rales. She exhibits no tenderness.  Clear anteriorly and posteriorly  Abdominal: Soft. Bowel sounds are normal. She exhibits no mass. There is no tenderness. There is no rebound and no guarding.  No abdominal  tenderness masses or organ enlargement  Musculoskeletal: Normal range of motion. She exhibits no edema.  Lymphadenopathy:    She has no cervical adenopathy.  Neurological: She is alert and oriented to person, place, and time.  Skin: Skin is warm and dry. No rash noted.  Psychiatric: She has a normal mood and affect. Her behavior is normal. Judgment and thought content normal.  Nursing note and vitals reviewed.  BP 159/75 mmHg  Pulse 65  Temp(Src) 97.2 F (36.2 C) (Oral)  Ht 5' 1" (1.549 m)  Wt 125 lb (56.7 kg)  BMI 23.63 kg/m2        Assessment & Plan:  1. Vitamin D deficiency -Continue current treatment pending results of lab work - VITAMIN D 25 Hydroxy (Vit-D Deficiency, Fractures)  2. Hyperlipidemia -Continue current treatment pending results of lab work - BMP8+EGFR - CBC with Differential/Platelet - Lipid panel - Hepatic function panel  3. Frequent urination -Make every effort to drink more fluids and stay well hydrated - Urinalysis, Complete - Urine culture - BMP8+EGFR - CBC with Differential/Platelet  4. Dizziness -Make sure that she stands the side the chair before she starts walking and sits on the bed before standing beside the bed and stand beside the bed before walking  Patient Instructions  The patient will make an effort to drink more fluids She understands that not enough fluids can cause a urinary tract infection and dehydration. The caregiver will remind her to do this but she is only there a couple hours a day. The patient was reminded to sit on the bed before standing and to stand beside the bed before walking. She is reminded to get up from the chair to stand in front of the chair for a few seconds before she starts walking.    Don W. Moore MD   

## 2016-03-05 NOTE — Telephone Encounter (Signed)
Patient's son wants to speak with Dr. Christell ConstantMoore about her appointment.

## 2016-03-06 LAB — BMP8+EGFR
BUN/Creatinine Ratio: 23 (ref 12–28)
BUN: 17 mg/dL (ref 10–36)
CALCIUM: 9.8 mg/dL (ref 8.7–10.3)
CO2: 28 mmol/L (ref 18–29)
CREATININE: 0.73 mg/dL (ref 0.57–1.00)
Chloride: 104 mmol/L (ref 96–106)
GFR calc Af Amer: 79 mL/min/{1.73_m2} (ref >59)
GFR calc non Af Amer: 68 mL/min/{1.73_m2} (ref >59)
GLUCOSE: 78 mg/dL (ref 65–99)
Potassium: 4.3 mmol/L (ref 3.5–5.2)
SODIUM: 147 mmol/L — AB (ref 134–144)

## 2016-03-06 LAB — CBC WITH DIFFERENTIAL/PLATELET
Basophils Absolute: 0 10*3/uL (ref 0.0–0.2)
Basos: 1 %
EOS (ABSOLUTE): 0 10*3/uL (ref 0.0–0.4)
EOS: 1 %
HEMATOCRIT: 44.6 % (ref 34.0–46.6)
Hemoglobin: 14.6 g/dL (ref 11.1–15.9)
IMMATURE GRANULOCYTES: 0 %
Immature Grans (Abs): 0 10*3/uL (ref 0.0–0.1)
Lymphocytes Absolute: 1.1 10*3/uL (ref 0.7–3.1)
Lymphs: 32 %
MCH: 31.2 pg (ref 26.6–33.0)
MCHC: 32.7 g/dL (ref 31.5–35.7)
MCV: 95 fL (ref 79–97)
MONOS ABS: 0.3 10*3/uL (ref 0.1–0.9)
Monocytes: 8 %
NEUTROS PCT: 58 %
Neutrophils Absolute: 2.1 10*3/uL (ref 1.4–7.0)
PLATELETS: 186 10*3/uL (ref 150–379)
RBC: 4.68 x10E6/uL (ref 3.77–5.28)
RDW: 15.4 % (ref 12.3–15.4)
WBC: 3.5 10*3/uL (ref 3.4–10.8)

## 2016-03-06 LAB — HEPATIC FUNCTION PANEL
ALK PHOS: 77 IU/L (ref 39–117)
ALT: 8 IU/L (ref 0–32)
AST: 14 IU/L (ref 0–40)
Albumin: 4.1 g/dL (ref 3.2–4.6)
BILIRUBIN, DIRECT: 0.1 mg/dL (ref 0.00–0.40)
Bilirubin Total: 0.3 mg/dL (ref 0.0–1.2)
Total Protein: 6.7 g/dL (ref 6.0–8.5)

## 2016-03-06 LAB — URINE CULTURE

## 2016-03-06 LAB — LIPID PANEL
Chol/HDL Ratio: 2.9 ratio units (ref 0.0–4.4)
Cholesterol, Total: 192 mg/dL (ref 100–199)
HDL: 66 mg/dL (ref >39)
LDL Calculated: 102 mg/dL — ABNORMAL HIGH (ref 0–99)
TRIGLYCERIDES: 119 mg/dL (ref 0–149)
VLDL Cholesterol Cal: 24 mg/dL (ref 5–40)

## 2016-03-06 LAB — VITAMIN D 25 HYDROXY (VIT D DEFICIENCY, FRACTURES): VIT D 25 HYDROXY: 42.8 ng/mL (ref 30.0–100.0)

## 2016-04-15 ENCOUNTER — Encounter: Payer: Self-pay | Admitting: *Deleted

## 2016-04-22 ENCOUNTER — Telehealth: Payer: Self-pay | Admitting: Family Medicine

## 2016-04-22 NOTE — Telephone Encounter (Signed)
Spoke to pt's son, Algernon HuxleyGlen and he just wanted to make [facility] aware that pt has been having a lot more confusion and has been out walking the highway in the middle of the night and calling the police when she misplaces things in her home. Asher MuirJamie is aware and has made a note in her appt comment for her upcoming appt.

## 2016-04-26 DIAGNOSIS — F0280 Dementia in other diseases classified elsewhere without behavioral disturbance: Secondary | ICD-10-CM | POA: Diagnosis not present

## 2016-04-26 DIAGNOSIS — Z888 Allergy status to other drugs, medicaments and biological substances status: Secondary | ICD-10-CM | POA: Diagnosis not present

## 2016-04-26 DIAGNOSIS — Z79899 Other long term (current) drug therapy: Secondary | ICD-10-CM | POA: Diagnosis not present

## 2016-04-26 DIAGNOSIS — G301 Alzheimer's disease with late onset: Secondary | ICD-10-CM | POA: Diagnosis not present

## 2016-04-30 ENCOUNTER — Telehealth: Payer: Self-pay | Admitting: Family Medicine

## 2016-04-30 NOTE — Telephone Encounter (Signed)
FYI

## 2016-04-30 NOTE — Telephone Encounter (Signed)
Please remember this for tomorrow

## 2016-05-01 ENCOUNTER — Encounter: Payer: Self-pay | Admitting: Family Medicine

## 2016-05-01 ENCOUNTER — Telehealth: Payer: Self-pay | Admitting: Family Medicine

## 2016-05-01 ENCOUNTER — Ambulatory Visit (INDEPENDENT_AMBULATORY_CARE_PROVIDER_SITE_OTHER): Payer: Medicare Other | Admitting: Family Medicine

## 2016-05-01 VITALS — BP 146/76 | HR 75 | Temp 97.0°F | Ht 61.0 in | Wt 124.8 lb

## 2016-05-01 DIAGNOSIS — E785 Hyperlipidemia, unspecified: Secondary | ICD-10-CM

## 2016-05-01 DIAGNOSIS — R413 Other amnesia: Secondary | ICD-10-CM

## 2016-05-01 DIAGNOSIS — E559 Vitamin D deficiency, unspecified: Secondary | ICD-10-CM

## 2016-05-01 NOTE — Progress Notes (Signed)
Subjective:    Patient ID: Courtney Frank, female    DOB: [birth year], 82 y.o.   MRN: [id number]  HPI Patient is here today for a general check up and to discuss her memory. We had a phone call to call the patient's son after we evaluate her today. Pending talking to her she is unable to take Aricept from the past. She is 82 years old. Before I went in the room today she was given an MMSE and scored 17 out of 30. The patient is able to carry on a good conversation laughing and smiling appropriately to comments made for her. I think she is doing incredibly well for being 82 years old despite scoring 17 out of 30 on her Mini-Mental status exam. She denies any chest pain shortness of breath trouble swallowing heartburn indigestion nausea vomiting diarrhea or blood in the stool. She has problems with an inguinal hernia but it does not cause any pain it is just there. The caregiver asked he comes back in now in the evening and stays with her until she goes to bed. The caregiver mentions that she has had some phone calls that have disturbed the patient's son at nighttime and now she can sort of take care of those phone calls herself and so the patient having to receive those.   Review of Systems  Constitutional: Negative.   HENT: Negative.   Eyes: Negative.   Respiratory: Negative.   Cardiovascular: Negative.   Gastrointestinal: Negative.   Endocrine: Negative.   Genitourinary: Negative.   Musculoskeletal: Negative.   Skin: Negative.   Allergic/Immunologic: Negative.   Neurological: Negative.   Hematological: Negative.   Psychiatric/Behavioral: Negative.         Patient Active Problem List   Diagnosis Date Noted  . Retinal hemorrhage noted on examination, right 09/07/2014  . Osteoporosis 03/09/2014  . Vitamin D deficiency 11/16/2013  . Nocturnal leg cramps 11/16/2013  . Insomnia 11/16/2013  . Urine frequency 08/04/2013  . Need for prophylactic vaccination against Streptococcus  pneumoniae (pneumococcus) 08/04/2013  . History of GI bleed   . Hyperlipidemia   . Incontinence   . Back pain   . Cataracts, bilateral   . Inguinal hernia    Outpatient Encounter Prescriptions as of 05/01/2016  Medication Sig  . acetaminophen (TYLENOL) 325 MG tablet Take 650 mg by mouth every 6 (six) hours as needed.    Marland Kitchen. BETIMOL 0.5 % ophthalmic solution   . calcium carbonate (OS-CAL) 600 MG TABS Take 600 mg by mouth 2 (two) times daily with a meal.    . Cholecalciferol (VITAMIN D3) 1000 UNITS CAPS Take 1 capsule by mouth daily.    Marland Kitchen. ezetimibe (ZETIA) 10 MG tablet Take 1 tablet (10 mg total) by mouth daily.  Marland Kitchen. omeprazole (PRILOSEC) 40 MG capsule TAKE ONE CAPSULE BY MOUTH ONCE DAILY 1  HOUR  BEFORE  A  MEAL  . oxybutynin (DITROPAN-XL) 10 MG 24 hr tablet TAKE ONE TABLET BY MOUTH ONCE DAILY  . raloxifene (EVISTA) 60 MG tablet TAKE ONE TABLET BY MOUTH ONCE DAILY  . simvastatin (ZOCOR) 80 MG tablet Take 1 tablet (80 mg total) by mouth daily.   No facility-administered encounter medications on file as of 05/01/2016.       Objective:   Physical Exam  Constitutional: She is oriented to person, place, and time. She appears well-developed and well-nourished.  HENT:  Head: Normocephalic.  Eyes: Conjunctivae and EOM are normal. Pupils are equal, round, and reactive to light.  Right eye exhibits no discharge. Left eye exhibits no discharge. No scleral icterus.  Neck: Normal range of motion.  Cardiovascular: Normal rate, regular rhythm and normal heart sounds.   No murmur heard. Pulmonary/Chest: Effort normal and breath sounds normal. She has no wheezes. She has no rales.  Abdominal: Soft. There is no tenderness. There is no rebound.  Musculoskeletal: Normal range of motion. She exhibits no edema.  Neurological: She is alert and oriented to person, place, and time.  Skin: Skin is warm and dry. No rash noted.  Psychiatric: She has a normal mood and affect. Her behavior is normal. Thought content  normal.  Nursing note and vitals reviewed.   BP 146/76 mmHg  Pulse 75  Temp(Src) 97 F (36.1 C) (Oral)  Ht  (1.549 m)  Wt 124 lb 12.8 oz (56.609 kg)  BMI 23.59 kg/m2       Assessment & Plan:  1. Memory impairment of gradual onset -The patient has had recent lab work and all of this was appropriate for her age. -She continues to take medication for cholesterol.  2. Vitamin D deficiency -She will continue with her vitamin D replacement  3. Hyperlipidemia -Continue diet and cholesterol management  Patient Instructions  We will call your son.  Please attend the senior citizens meals as much as possible.     Nyra Capes MD

## 2016-05-01 NOTE — Patient Instructions (Signed)
We will call your son.  Please attend the senior citizens meals as much as possible.

## 2016-05-01 NOTE — Telephone Encounter (Signed)
The patient has apparently been to the memory disorder clinic at [facility] and they told the patient and her son that the patient did not need to be living by herself at all. I had not seen that note. I did speak with the son on the phone and he is looking at assisted living facility's [place] and [place] also. He is agreed to get back in touch with me if I can be of any further help.

## 2016-05-02 ENCOUNTER — Telehealth: Payer: Self-pay | Admitting: Family Medicine

## 2016-05-02 NOTE — Telephone Encounter (Signed)
They need a letter stating that she is incompetent not competent. Please route to Almira CoasterGina as I am out of the office Friday and Monday.

## 2016-05-03 NOTE — Telephone Encounter (Signed)
Because of this patient's age she should not stay at home at night by herself without someone being with her. She needs 24-hour care and supervision and assistance.

## 2016-05-03 NOTE — Telephone Encounter (Signed)
Letter being typed by Vivi FernsLue Ann Tucker

## 2016-05-10 ENCOUNTER — Telehealth: Payer: Self-pay | Admitting: Family Medicine

## 2016-05-13 ENCOUNTER — Telehealth: Payer: Self-pay | Admitting: Family Medicine

## 2016-05-13 NOTE — Telephone Encounter (Signed)
Courtney Frank left another message -  more urgent now:  More altered memory recently, she has called 911 when she lost her purse. Her estate is in a trust and they are unable to help her get into a assisted living without one of them becoming a Barrister's clerktrustee of her estate.  She needs to have a letter that states she is not competent of managing her financials AND she is not capable of living alone.   Son aware that you may call him.

## 2016-05-13 NOTE — Telephone Encounter (Signed)
Please give signed a note stating she is not confident of managing her financials and not capable of living by herself

## 2016-05-13 NOTE — Telephone Encounter (Signed)
Noted on previous encounter.

## 2016-05-13 NOTE — Telephone Encounter (Signed)
Letter written and son aware

## 2016-06-03 ENCOUNTER — Other Ambulatory Visit: Payer: Self-pay | Admitting: Family Medicine

## 2016-06-21 ENCOUNTER — Ambulatory Visit: Payer: Medicare Other

## 2016-06-25 ENCOUNTER — Ambulatory Visit (INDEPENDENT_AMBULATORY_CARE_PROVIDER_SITE_OTHER): Payer: Medicare Other | Admitting: *Deleted

## 2016-06-25 DIAGNOSIS — Z111 Encounter for screening for respiratory tuberculosis: Secondary | ICD-10-CM

## 2016-06-25 NOTE — Progress Notes (Signed)
PPd skin test placed on left forearm and patient tolerated well.

## 2016-06-25 NOTE — Patient Instructions (Signed)
Tuberculin Skin Test WHY AM I HAVING THIS TEST? Tuberculosis (TB) is a bacterial infection caused by Mycobacterium tuberculosis. Most people who are exposed to these bacteria have a strong enough defense (immune) system to prevent the bacteria from causing TB and developing symptoms. Their bodies prevent the germs from being active and making them sick (latent TB infection).  However, if you have TB germs in your body and your immune system is weak, you can develop a TB infection. This can cause symptoms such as:   Night sweats.  Fever.  Weakness.  Weight loss. A latent TB infection can also become active later in life if your immune system becomes weakened or compromised. You may have this test if your health care provider suspects that you have TB. You may also have this test to screen for TB if you are at risk for getting the disease. Those at increased risk include:  People who inject illegal drugs or share needles.  People with HIV or other diseases that affect immunity.  Health care workers.  People who live in high-risk communities, such as homeless shelters, nursing homes, and correctional facilities.  People who have been in contact with someone with TB.  People from countries where TB is more common. If you are in a high-risk group, your health care provider may wish to screen for TB more often. This can help prevent the spread of the disease. Sometimes TB screening is required when starting a new job, such as becoming a health care worker or a teacher. Colleges or universities may require it of new students. HOW WILL I BE TESTED? A tuberculin skin test is the main test used to check for exposure to the bacteria that can cause TB. The test checks for antibodies to the bacteria. Antibodies are proteins that your body produces to protect you from germs and other things that can make you sick. Your health care provider will inject a solution known as PPD (purified protein  derivative) under the first layer of skin on your arm. This causes a blister-like bubble to form at the site. Your health care provider will then examine the site after a number of hours have passed to see if a reaction has occurred. HOW DO I PREPARE FOR THE TEST? There is no preparation required for this test. WHAT DO THE RESULTS MEAN? Your test results will be reported as either negative or positive.  If the tuberculin skin test produces a negative result, it is likely that you do not have TB and have not been exposed to the TB bacteria. If you or your health care provider suspects exposure, however, you may want to repeat the test a few weeks later. A blood test may also be used to check for TB. This is because you will not react to the tuberculin skin test until several weeks after exposure to TB bacteria. If you test positive to the tuberculin skin test, it is likely that you have been exposed to TB bacteria. The test does not distinguish between an active and a latent TB infection. A false-positive result can occur. A false-positive result for TB bacteria is incorrect because it indicates a condition or finding is present when it is not. Talk to your health care provider to discuss your results, treatment options, and if necessary, the need for more tests. It is your responsibility to obtain your test results. Ask the lab or department performing the test when and how you will get your results. Talk with your   health care provider if you have any questions about your results.   This information is not intended to replace advice given to you by your health care provider. Make sure you discuss any questions you have with your health care provider.   Document Released: 08/07/2005 Document Revised: 11/18/2014 Document Reviewed: 02/21/2014 Elsevier Interactive Patient Education 2016 Elsevier Inc.  

## 2016-06-28 ENCOUNTER — Ambulatory Visit: Payer: Medicare Other

## 2016-06-28 DIAGNOSIS — Z111 Encounter for screening for respiratory tuberculosis: Secondary | ICD-10-CM

## 2016-06-28 LAB — TB SKIN TEST
INDURATION: 0 mm
TB Skin Test: NEGATIVE

## 2016-06-28 NOTE — Progress Notes (Signed)
Tb skin test negative. Documentation given to patient

## 2016-07-05 ENCOUNTER — Ambulatory Visit: Payer: Medicare Other | Admitting: Family Medicine

## 2016-07-15 ENCOUNTER — Other Ambulatory Visit: Payer: Self-pay | Admitting: Family Medicine

## 2016-08-16 ENCOUNTER — Ambulatory Visit: Payer: Medicare Other | Admitting: Family Medicine

## 2016-08-21 ENCOUNTER — Ambulatory Visit (INDEPENDENT_AMBULATORY_CARE_PROVIDER_SITE_OTHER): Payer: Medicare Other | Admitting: Family Medicine

## 2016-08-21 ENCOUNTER — Encounter: Payer: Self-pay | Admitting: Family Medicine

## 2016-08-21 VITALS — BP 132/74 | HR 62 | Temp 97.1°F | Ht 61.0 in | Wt 119.0 lb

## 2016-08-21 DIAGNOSIS — E559 Vitamin D deficiency, unspecified: Secondary | ICD-10-CM

## 2016-08-21 DIAGNOSIS — E7800 Pure hypercholesterolemia, unspecified: Secondary | ICD-10-CM | POA: Diagnosis not present

## 2016-08-21 DIAGNOSIS — Z23 Encounter for immunization: Secondary | ICD-10-CM | POA: Diagnosis not present

## 2016-08-21 DIAGNOSIS — R413 Other amnesia: Secondary | ICD-10-CM | POA: Diagnosis not present

## 2016-08-21 NOTE — Progress Notes (Signed)
Subjective:    Patient ID: Courtney Frank, female    DOB: [birth year], 82 y.o.   MRN: [id number]  HPI Pt here for follow up and management of chronic medical problems which includes hyperlipidemia. She is accompanied today by one of her caregivers.The patient remains at home with 24-hour care. The patient has no complaints. She does not need any refills on her medicines. She'll be given an FOBT to return today will get lab work today and will be given her flu shot today. The patient denies any chest pain shortness of breath trouble with her stomach including nausea vomiting diarrhea or blood in the stool and trouble passing her water. The caregiver says her skin condition is good and that she in fact thinks the patient has been doing better recently than several months ago.    Patient Active Problem List   Diagnosis Date Noted  . Retinal hemorrhage noted on examination, right 09/07/2014  . Osteoporosis 03/09/2014  . Vitamin D deficiency 11/16/2013  . Nocturnal leg cramps 11/16/2013  . Insomnia 11/16/2013  . Urine frequency 08/04/2013  . Need for prophylactic vaccination against Streptococcus pneumoniae (pneumococcus) 08/04/2013  . History of GI bleed   . Hyperlipidemia   . Incontinence   . Back pain   . Cataracts, bilateral   . Inguinal hernia    Outpatient Encounter Prescriptions as of 08/21/2016  Medication Sig  . acetaminophen (TYLENOL) 325 MG tablet Take 650 mg by mouth every 6 (six) hours as needed.    Marland Kitchen BETIMOL 0.5 % ophthalmic solution   . calcium carbonate (OS-CAL) 600 MG TABS Take 600 mg by mouth 2 (two) times daily with a meal.    . Cholecalciferol (VITAMIN D3) 1000 UNITS CAPS Take 1 capsule by mouth daily.    Marland Kitchen ezetimibe (ZETIA) 10 MG tablet Take 1 tablet (10 mg total) by mouth daily.  Marland Kitchen omeprazole (PRILOSEC) 40 MG capsule TAKE ONE CAPSULE BY MOUTH ONCE DAILY 1  HOUR  BEFORE  A  MEAL  . oxybutynin (DITROPAN-XL) 10 MG 24 hr tablet TAKE ONE TABLET BY MOUTH ONCE DAILY  .  raloxifene (EVISTA) 60 MG tablet TAKE ONE TABLET BY MOUTH ONCE DAILY  . simvastatin (ZOCOR) 80 MG tablet Take 1 tablet (80 mg total) by mouth daily.  . [DISCONTINUED] oxybutynin (DITROPAN-XL) 10 MG 24 hr tablet TAKE ONE TABLET BY MOUTH ONCE DAILY   No facility-administered encounter medications on file as of 08/21/2016.      Review of Systems  Constitutional: Negative.   HENT: Negative.   Eyes: Negative.   Respiratory: Negative.   Cardiovascular: Negative.   Gastrointestinal: Negative.   Endocrine: Negative.   Genitourinary: Negative.   Musculoskeletal: Negative.   Skin: Negative.   Allergic/Immunologic: Negative.   Neurological: Negative.   Psychiatric/Behavioral: Negative.        Objective:   Physical Exam  Constitutional: She is oriented to person, place, and time. She appears well-developed and well-nourished. No distress.  The patient is alert and pleasant and cooperative  HENT:  Head: Normocephalic and atraumatic.  Right Ear: External ear normal.  Left Ear: External ear normal.  Nose: Nose normal.  Mouth/Throat: Oropharynx is clear and moist. No oropharyngeal exudate.  Eyes: Conjunctivae and EOM are normal. Pupils are equal, round, and reactive to light. Right eye exhibits no discharge. Left eye exhibits no discharge. No scleral icterus.  Neck: Normal range of motion. Neck supple. No thyromegaly present.  Neck without bruits thyromegaly or anterior cervical adenopathy  Cardiovascular: Normal  rate, regular rhythm, normal heart sounds and intact distal pulses.   No murmur heard. Pulmonary/Chest: Effort normal and breath sounds normal. No respiratory distress. She has no wheezes. She has no rales.  Abdominal: Soft. Bowel sounds are normal. She exhibits no mass. There is no tenderness. There is no rebound and no guarding.  Genitourinary:  Genitourinary Comments: Breast exam today within normal limits and no axillary adenopathy  Musculoskeletal: Normal range of motion. She  exhibits no edema or tenderness.  Lymphadenopathy:    She has no cervical adenopathy.  Neurological: She is alert and oriented to person, place, and time. She has normal reflexes. No cranial nerve deficit.  Skin: Skin is warm and dry.  Psychiatric: She has a normal mood and affect. Her behavior is normal. Judgment and thought content normal.  Nursing note and vitals reviewed.  BP 132/74 (BP Location: Left Arm)   Pulse 62   Temp 97.1 F (36.2 C) (Oral)   Ht 5' 1"  (1.549 m)   Wt 119 lb (54 kg)   BMI 22.48 kg/m         Assessment & Plan:  1. Vitamin D deficiency -Continue current treatment pending results of lab work - CBC with Differential/Platelet - VITAMIN D 25 Hydroxy (Vit-D Deficiency, Fractures)  2. Pure hypercholesterolemia -Continue current treatment pending results of lab work - BMP8+EGFR - CBC with Differential/Platelet - Hepatic function panel - Lipid panel  3. Memory impairment of gradual onset -The patient was very alert today and on top of answering questions appropriately and recognizing things in the exam room. She was pleasant and probably the healthiest patient on sitting all day today. - CBC with Differential/Platelet  4. Encounter for immunization - Flu vaccine HIGH DOSE PF  Patient Instructions                       Medicare Annual Wellness Visit  Welcome and the medical providers at [facility] strive to bring you the best medical care.  In doing so we not only want to address your current medical conditions and concerns but also to detect new conditions early and prevent illness, disease and health-related problems.    Medicare offers a yearly Wellness Visit which allows our clinical staff to assess your need for preventative services including immunizations, lifestyle education, counseling to decrease risk of preventable diseases and screening for fall risk and other medical concerns.    This visit is provided free of  charge (no copay) for all Medicare recipients. The clinical pharmacists at [facility] have begun to conduct these Wellness Visits which will also include a thorough review of all your medications.    As you primary medical provider recommend that you make an appointment for your Annual Wellness Visit if you have not done so already this year.  You may set up this appointment before you leave today or you may call back [phone]) and schedule an appointment.  Please make sure when you call that you mention that you are scheduling your Annual Wellness Visit with the clinical pharmacist so that the appointment may be made for the proper length of time.    Continue current medications. Continue good therapeutic lifestyle changes which include good diet and exercise. Fall precautions discussed with patient. If an FOBT was given today- please return it to our front desk. If you are over 80 years old - you may need Prevnar 43 or the adult Pneumonia vaccine.  **Flu  shots are available--- please call and schedule a FLU-CLINIC appointment**  After your visit with [place] today you will receive a survey in the mail or online from [employer] regarding your care with [place]. Please take a moment to fill this out. Your feedback is very important to [place] as you can help [place] better understand your patient needs as well as improve your experience and satisfaction. WE CARE ABOUT YOU!!!   Continue to be careful and did not put yourself at risk for falling Drink plenty of fluids Today well hydrated Continue with current medication Flu shot that you received today may make your arm sore    Arrie Senate MD

## 2016-08-21 NOTE — Patient Instructions (Addendum)
Medicare Annual Wellness Visit  [place] and the medical providers at [facility] strive to bring you the best medical care.  In doing so we not only want to address your current medical conditions and concerns but also to detect new conditions early and prevent illness, disease and health-related problems.    Medicare offers a yearly Wellness Visit which allows our clinical staff to assess your need for preventative services including immunizations, lifestyle education, counseling to decrease risk of preventable diseases and screening for fall risk and other medical concerns.    This visit is provided free of charge (no copay) for all Medicare recipients. The clinical pharmacists at [facility] have begun to conduct these Wellness Visits which will also include a thorough review of all your medications.    As you primary medical provider recommend that you make an appointment for your Annual Wellness Visit if you have not done so already this year.  You may set up this appointment before you leave today or you may call back [phone] and schedule an appointment.  Please make sure when you call that you mention that you are scheduling your Annual Wellness Visit with the clinical pharmacist so that the appointment may be made for the proper length of time.    Continue current medications. Continue good therapeutic lifestyle changes which include good diet and exercise. Fall precautions discussed with patient. If an FOBT was given today- please return it to our front desk. If you are over 80 years old - you may need Prevnar 13 or the adult Pneumonia vaccine.  **Flu shots are available--- please call and schedule a FLU-CLINIC appointment**  After your visit with us today you will receive a survey in the mail or online from [employer] regarding your care with us. Please take a moment to fill this out. Your feedback is very  important to us as you can help us better understand your patient needs as well as improve your experience and satisfaction. WE CARE ABOUT YOU!!!   Continue to be careful and did not put yourself at risk for falling Drink plenty of fluids Today well hydrated Continue with current medication Flu shot that you received today may make your arm sore

## 2016-08-22 LAB — CBC WITH DIFFERENTIAL/PLATELET
BASOS ABS: 0 10*3/uL (ref 0.0–0.2)
Basos: 0 %
EOS (ABSOLUTE): 0 10*3/uL (ref 0.0–0.4)
EOS: 1 %
HEMATOCRIT: 38.9 % (ref 34.0–46.6)
HEMOGLOBIN: 13.6 g/dL (ref 11.1–15.9)
IMMATURE GRANULOCYTES: 0 %
Immature Grans (Abs): 0 10*3/uL (ref 0.0–0.1)
Lymphocytes Absolute: 1 10*3/uL (ref 0.7–3.1)
Lymphs: 25 %
MCH: 32.2 pg (ref 26.6–33.0)
MCHC: 35 g/dL (ref 31.5–35.7)
MCV: 92 fL (ref 79–97)
MONOCYTES: 11 %
MONOS ABS: 0.4 10*3/uL (ref 0.1–0.9)
NEUTROS PCT: 63 %
Neutrophils Absolute: 2.5 10*3/uL (ref 1.4–7.0)
Platelets: 185 10*3/uL (ref 150–379)
RBC: 4.23 x10E6/uL (ref 3.77–5.28)
RDW: 13.7 % (ref 12.3–15.4)
WBC: 3.9 10*3/uL (ref 3.4–10.8)

## 2016-08-22 LAB — BMP8+EGFR
BUN / CREAT RATIO: 18 (ref 12–28)
BUN: 12 mg/dL (ref 10–36)
CALCIUM: 9.3 mg/dL (ref 8.7–10.3)
CHLORIDE: 103 mmol/L (ref 96–106)
CO2: 28 mmol/L (ref 18–29)
CREATININE: 0.67 mg/dL (ref 0.57–1.00)
GFR calc non Af Amer: 73 mL/min/{1.73_m2} (ref >59)
GFR, EST AFRICAN AMERICAN: 84 mL/min/{1.73_m2} (ref >59)
Glucose: 74 mg/dL (ref 65–99)
Potassium: 4.3 mmol/L (ref 3.5–5.2)
Sodium: 144 mmol/L (ref 134–144)

## 2016-08-22 LAB — LIPID PANEL
CHOL/HDL RATIO: 2.5 ratio (ref 0.0–4.4)
CHOLESTEROL TOTAL: 141 mg/dL (ref 100–199)
HDL: 56 mg/dL (ref >39)
LDL CALC: 64 mg/dL (ref 0–99)
TRIGLYCERIDES: 106 mg/dL (ref 0–149)
VLDL CHOLESTEROL CAL: 21 mg/dL (ref 5–40)

## 2016-08-22 LAB — HEPATIC FUNCTION PANEL
ALBUMIN: 3.7 g/dL (ref 3.2–4.6)
ALT: 11 IU/L (ref 0–32)
AST: 19 IU/L (ref 0–40)
Alkaline Phosphatase: 84 IU/L (ref 39–117)
BILIRUBIN TOTAL: 0.2 mg/dL (ref 0.0–1.2)
Bilirubin, Direct: 0.09 mg/dL (ref 0.00–0.40)
TOTAL PROTEIN: 5.8 g/dL — AB (ref 6.0–8.5)

## 2016-08-22 LAB — VITAMIN D 25 HYDROXY (VIT D DEFICIENCY, FRACTURES): Vit D, 25-Hydroxy: 49.3 ng/mL (ref 30.0–100.0)

## 2016-08-26 ENCOUNTER — Telehealth: Payer: Self-pay | Admitting: Family Medicine

## 2016-08-26 NOTE — Telephone Encounter (Signed)
Pt aware.

## 2016-08-27 DIAGNOSIS — I70203 Unspecified atherosclerosis of native arteries of extremities, bilateral legs: Secondary | ICD-10-CM | POA: Diagnosis not present

## 2016-08-27 DIAGNOSIS — L84 Corns and callosities: Secondary | ICD-10-CM | POA: Diagnosis not present

## 2016-08-27 DIAGNOSIS — B351 Tinea unguium: Secondary | ICD-10-CM | POA: Diagnosis not present

## 2016-09-08 ENCOUNTER — Encounter (HOSPITAL_COMMUNITY): Payer: Self-pay | Admitting: Cardiology

## 2016-09-08 ENCOUNTER — Emergency Department (HOSPITAL_COMMUNITY)
Admission: EM | Admit: 2016-09-08 | Discharge: 2016-09-08 | Disposition: A | Discharge status: Home / Self Care | Payer: Medicare Other | Attending: Dermatology | Admitting: Dermatology

## 2016-09-08 DIAGNOSIS — J3489 Other specified disorders of nose and nasal sinuses: Secondary | ICD-10-CM | POA: Insufficient documentation

## 2016-09-08 DIAGNOSIS — Y999 Unspecified external cause status: Secondary | ICD-10-CM | POA: Diagnosis not present

## 2016-09-08 DIAGNOSIS — Y92002 Bathroom of unspecified non-institutional (private) residence single-family (private) house as the place of occurrence of the external cause: Secondary | ICD-10-CM | POA: Diagnosis not present

## 2016-09-08 DIAGNOSIS — Z79899 Other long term (current) drug therapy: Secondary | ICD-10-CM | POA: Insufficient documentation

## 2016-09-08 DIAGNOSIS — Y939 Activity, unspecified: Secondary | ICD-10-CM | POA: Diagnosis not present

## 2016-09-08 DIAGNOSIS — Z5321 Procedure and treatment not carried out due to patient leaving prior to being seen by health care provider: Secondary | ICD-10-CM | POA: Insufficient documentation

## 2016-09-08 DIAGNOSIS — W19XXXA Unspecified fall, initial encounter: Secondary | ICD-10-CM | POA: Diagnosis not present

## 2016-09-08 HISTORY — DX: Unspecified dementia, unspecified severity, without behavioral disturbance, psychotic disturbance, mood disturbance, and anxiety: F03.90

## 2016-09-08 NOTE — ED Triage Notes (Signed)
Fall this morning in bathroom.  Caretaker heard her fall and went to check on her.  Per family pt has a swollen lip.  Pt c/o nose pain.  Pt has dementia.

## 2016-09-08 NOTE — ED Notes (Signed)
Patient called for room, no answer x3. Per registration patient left.

## 2016-09-09 ENCOUNTER — Encounter: Payer: Self-pay | Admitting: Pediatrics

## 2016-09-09 ENCOUNTER — Ambulatory Visit (INDEPENDENT_AMBULATORY_CARE_PROVIDER_SITE_OTHER): Payer: Medicare Other | Admitting: Pediatrics

## 2016-09-09 VITALS — BP 129/69 | HR 65 | Temp 97.9°F | Ht 62.0 in | Wt 120.6 lb

## 2016-09-09 DIAGNOSIS — R220 Localized swelling, mass and lump, head: Secondary | ICD-10-CM | POA: Diagnosis not present

## 2016-09-09 NOTE — Progress Notes (Signed)
  Subjective:   Patient ID: Courtney Frank, female    DOB: [birth year], 82 y.o.   MRN: [id number] CC: Oral Swelling  HPI: Courtney Frank is a 82 y.o. female presenting for Oral Swelling  Lip started swelling two days ago Sunday morning aide noticed Pt doesn't remember hitting her lip, but thinks she might have  Has small scab below bottom lip and healing abrasion just inside lip Takes tylenol sometimes for pain No new foods, no new medicines Not taking any NSAIDs or ASA containing medications No itching, no hives Never had lip swelling like this before Is slightly tender to the touch No tongue swelling, breathing is fine, swallowing is fine Denies recent falls Sometimes feels slightly unsteady on her feet at night when getting up to use bathroom Aide with her 24h a day  Relevant past medical, surgical, family and social history reviewed. Allergies and medications reviewed and updated. History  Smoking Status  . Never Smoker  Smokeless Tobacco  . Never Used   ROS: Per HPI   Objective:    BP 129/69   Pulse 65   Temp 97.9 F (36.6 C) (Oral)   Ht 5\' 2"  (1.575 m)   Wt 120 lb 9.6 oz (54.7 kg)   BMI 22.06 kg/m   Wt Readings from Last 3 Encounters:  09/09/16 120 lb 9.6 oz (54.7 kg)  09/08/16 119 lb (54 kg)  08/21/16 119 lb (54 kg)    Gen: NAD, alert, cooperative with exam, NCAT EYES: EOMI, no conjunctival injection, or no icterus ENT:  OP without erythema, small 3mm by 1 mm scab at the midline  lower lip vermilion border. Two white based shallow ulcers upper inside lower lip LYMPH: no cervical LAD CV: NRRR, normal S1/S2 Resp: CTABL, no wheezes, normal WOB Abd: +BS, soft, NTND. Ext: No edema, warm Neuro: Alert and oriented  Assessment & Plan:  Courtney Frank was seen today for oral swelling.  Diagnoses and all orders for this visit:  Lip swelling Has possible tooth marks inside lip, scab on outside center of lip No other symptoms other than lower lip swelling Pt has  some baseline dementia and memory problems per aide and pt Never had angioedema before Today swelling is less than yesterday No new foods/meds Likely due to mechanical injury, use cold compressions, discussed return precautions, RTC if any worsening of swelling Encouraged pt to wake up aide if needing to use bathroom in the middle of night  Follow up plan: Return if symptoms worsen or fail to improve. Rex Krasarol Vincent, MD [facility]

## 2016-09-18 ENCOUNTER — Other Ambulatory Visit: Payer: Self-pay | Admitting: Family Medicine

## 2016-10-28 ENCOUNTER — Other Ambulatory Visit: Payer: Self-pay | Admitting: Family Medicine

## 2016-10-30 ENCOUNTER — Telehealth: Payer: Self-pay | Admitting: Family Medicine

## 2016-10-30 MED ORDER — BUSPIRONE HCL 10 MG PO TABS: 10.0000 mg | ORAL_TABLET | Freq: Every day | ORAL | 0 refills | Status: DC

## 2016-10-30 NOTE — Telephone Encounter (Signed)
[facility] and [facility] aware

## 2016-10-30 NOTE — Telephone Encounter (Signed)
Try BuSpar 10 mg 1 nightly #30

## 2016-11-21 ENCOUNTER — Encounter (INDEPENDENT_AMBULATORY_CARE_PROVIDER_SITE_OTHER): Payer: Self-pay

## 2016-11-21 ENCOUNTER — Ambulatory Visit (INDEPENDENT_AMBULATORY_CARE_PROVIDER_SITE_OTHER): Payer: Medicare Other | Admitting: Family Medicine

## 2016-11-21 ENCOUNTER — Encounter: Payer: Self-pay | Admitting: Family Medicine

## 2016-11-21 VITALS — BP 137/81 | HR 78 | Temp 97.6°F | Ht 62.0 in | Wt 117.0 lb

## 2016-11-21 DIAGNOSIS — E7800 Pure hypercholesterolemia, unspecified: Secondary | ICD-10-CM | POA: Diagnosis not present

## 2016-11-21 DIAGNOSIS — R413 Other amnesia: Secondary | ICD-10-CM

## 2016-11-21 DIAGNOSIS — E559 Vitamin D deficiency, unspecified: Secondary | ICD-10-CM | POA: Diagnosis not present

## 2016-11-21 NOTE — Patient Instructions (Signed)
The patient is concerned that her health care needs and supervision are not being taken care of properly. She comes to the visit today with her sister who shares the same opinion. She would like to continue to stay at home with his much supervision as necessary. She would like to make sure that those responsible for her medical supervision are being monitored properly.

## 2016-11-21 NOTE — Progress Notes (Signed)
Subjective:    Patient ID: Courtney Frank, female    DOB: [birth year], 83 y.o.   MRN: [id number]  HPI  Patient here today for memory evaluation.The visit was scheduled today because of what the family perceives as a decline in the patient's memory. The sister comes with her today and states that there has been more confusion and more changes in her memory recently. She was given many mental status exam before I went in the room and she scored 22 out of 30. This is certainly a change from the past. Her most recent MMSE before today was in June 2017, she scored 17 out of 30. The patient has always come in and answered questions appropriately that were asked of her. Today she denies any chest pain or shortness of breath or trouble with her GI tract including nausea vomiting diarrhea or blood in the stool. She also denies any problems with passing her water.   Patient Active Problem List   Diagnosis Date Noted  . Retinal hemorrhage noted on examination, right 09/07/2014  . Osteoporosis 03/09/2014  . Vitamin D deficiency 11/16/2013  . Nocturnal leg cramps 11/16/2013  . Insomnia 11/16/2013  . Urine frequency 08/04/2013  . Need for prophylactic vaccination against Streptococcus pneumoniae (pneumococcus) 08/04/2013  . History of GI bleed   . Hyperlipidemia   . Incontinence   . Back pain   . Cataracts, bilateral   . Inguinal hernia    Outpatient Encounter Prescriptions as of 11/21/2016  Medication Sig  . acetaminophen (TYLENOL) 325 MG tablet Take 650 mg by mouth every 6 (six) hours as needed.    Marland Kitchen BETIMOL 0.5 % ophthalmic solution   . busPIRone (BUSPAR) 10 MG tablet Take 1 tablet (10 mg total) by mouth at bedtime.  . calcium carbonate (OS-CAL) 600 MG TABS Take 600 mg by mouth 2 (two) times daily with a meal.    . Cholecalciferol (VITAMIN D3) 1000 UNITS CAPS Take 1 capsule by mouth daily.    Marland Kitchen ezetimibe (ZETIA) 10 MG tablet TAKE ONE TABLET BY MOUTH ONCE DAILY  . omeprazole (PRILOSEC) 40 MG  capsule TAKE ONE CAPSULE BY MOUTH ONCE DAILY 1  HOUR  BEFORE  A  MEAL  . oxybutynin (DITROPAN-XL) 10 MG 24 hr tablet TAKE ONE TABLET BY MOUTH ONCE DAILY  . raloxifene (EVISTA) 60 MG tablet TAKE ONE TABLET BY MOUTH ONCE DAILY  . simvastatin (ZOCOR) 80 MG tablet TAKE ONE TABLET BY MOUTH ONCE DAILY  . [DISCONTINUED] omeprazole (PRILOSEC) 40 MG capsule TAKE ONE CAPSULE BY MOUTH ONCE DAILY 1  HOUR  BEFORE  A  MEAL  . [DISCONTINUED] raloxifene (EVISTA) 60 MG tablet TAKE ONE TABLET BY MOUTH ONCE DAILY   No facility-administered encounter medications on file as of 11/21/2016.      Review of Systems  Constitutional: Negative.   HENT: Negative.   Eyes: Negative.   Respiratory: Negative.   Cardiovascular: Negative.   Gastrointestinal: Negative.   Endocrine: Negative.   Genitourinary: Negative.   Musculoskeletal: Negative.   Skin: Negative.   Allergic/Immunologic: Negative.   Neurological: Negative.   Hematological: Negative.   Psychiatric/Behavioral: Positive for confusion.       Objective:   Physical Exam  Constitutional: She is oriented to person, place, and time. She appears well-developed and well-nourished. No distress.  The patient remains alert for her age of 83 years. She admits to memory issues. She was very appropriate during the visit today with answering questions and responding to questions asked of  her.  HENT:  Head: Normocephalic and atraumatic.  Right Ear: External ear normal.  Left Ear: External ear normal.  Nose: Nose normal.  Mouth/Throat: Oropharynx is clear and moist.  Eyes: Conjunctivae and EOM are normal. Pupils are equal, round, and reactive to light. Right eye exhibits no discharge. Left eye exhibits no discharge. No scleral icterus.  Neck: Normal range of motion. Neck supple. No JVD present. No thyromegaly present.  Cardiovascular: Normal rate, regular rhythm and normal heart sounds.   No murmur heard. Pulmonary/Chest: Effort normal and breath sounds normal. No  respiratory distress. She has no wheezes. She has no rales.  Clear anteriorly and posteriorly  Abdominal: Soft. Bowel sounds are normal. She exhibits no mass. There is no tenderness. There is no rebound and no guarding.  Musculoskeletal: Normal range of motion. She exhibits no edema.  Lymphadenopathy:    She has no cervical adenopathy.  Neurological: She is alert and oriented to person, place, and time. No cranial nerve deficit.  Skin: Skin is warm and dry. No rash noted.  Psychiatric: She has a normal mood and affect. Her behavior is normal. Judgment and thought content normal.  Nursing note and vitals reviewed.  BP 137/81 (BP Location: Right Arm)   Pulse 78   Temp 97.6 F (36.4 C) (Oral)   Ht 5' 2"  (1.575 m)   Wt 117 lb (53.1 kg)   BMI 21.40 kg/m         Assessment & Plan:  1. Memory changes -The patient's memory appears stable and may be slightly improved from what it has been in the past with an MMSE score today of 22 out of 30. In June 2017 it was 17 out of 30. - CBC with Differential/Platelet - BMP8+EGFR - Thyroid Panel With TSH - Hepatic function panel  2. Pure hypercholesterolemia -Continue with current treatment and as aggressive therapeutic lifestyle changes as possible pending results of lab work  3. Vitamin D deficiency -Continue with current treatment pending results of lab work -Continue with weightbearing exercise and make all efforts to keep herself from falling and preventing fractures   No orders of the defined types were placed in this encounter.  Patient Instructions  The patient is concerned that her health care needs and supervision are not being taken care of properly. She comes to the visit today with her sister who shares the same opinion. She would like to continue to stay at home with his much supervision as necessary. She would like to make sure that those responsible for her medical supervision are being monitored properly.  Arrie Senate  MD

## 2016-11-22 LAB — CBC WITH DIFFERENTIAL/PLATELET
BASOS ABS: 0 10*3/uL (ref 0.0–0.2)
Basos: 1 %
EOS (ABSOLUTE): 0 10*3/uL (ref 0.0–0.4)
Eos: 1 %
HEMOGLOBIN: 14.2 g/dL (ref 11.1–15.9)
Hematocrit: 42.3 % (ref 34.0–46.6)
Immature Grans (Abs): 0 10*3/uL (ref 0.0–0.1)
Immature Granulocytes: 0 %
LYMPHS ABS: 1 10*3/uL (ref 0.7–3.1)
Lymphs: 26 %
MCH: 32.1 pg (ref 26.6–33.0)
MCHC: 33.6 g/dL (ref 31.5–35.7)
MCV: 96 fL (ref 79–97)
MONOS ABS: 0.2 10*3/uL (ref 0.1–0.9)
Monocytes: 6 %
Neutrophils Absolute: 2.5 10*3/uL (ref 1.4–7.0)
Neutrophils: 66 %
PLATELETS: 201 10*3/uL (ref 150–379)
RBC: 4.42 x10E6/uL (ref 3.77–5.28)
RDW: 14.4 % (ref 12.3–15.4)
WBC: 3.8 10*3/uL (ref 3.4–10.8)

## 2016-11-22 LAB — THYROID PANEL WITH TSH
Free Thyroxine Index: 2.5 (ref 1.2–4.9)
T3 Uptake Ratio: 28 % (ref 24–39)
T4 TOTAL: 8.8 ug/dL (ref 4.5–12.0)
TSH: 1.99 u[IU]/mL (ref 0.450–4.500)

## 2016-11-22 LAB — BMP8+EGFR
BUN / CREAT RATIO: 20 (ref 12–28)
BUN: 16 mg/dL (ref 10–36)
CO2: 28 mmol/L (ref 18–29)
Calcium: 9.5 mg/dL (ref 8.7–10.3)
Chloride: 104 mmol/L (ref 96–106)
Creatinine, Ser: 0.82 mg/dL (ref 0.57–1.00)
GFR calc Af Amer: 68 mL/min/{1.73_m2} (ref >59)
GFR calc non Af Amer: 59 mL/min/{1.73_m2} — ABNORMAL LOW (ref >59)
GLUCOSE: 100 mg/dL — AB (ref 65–99)
POTASSIUM: 4.7 mmol/L (ref 3.5–5.2)
SODIUM: 146 mmol/L — AB (ref 134–144)

## 2016-11-22 LAB — HEPATIC FUNCTION PANEL
ALBUMIN: 4 g/dL (ref 3.2–4.6)
ALK PHOS: 82 IU/L (ref 39–117)
ALT: 11 IU/L (ref 0–32)
AST: 19 IU/L (ref 0–40)
BILIRUBIN, DIRECT: 0.1 mg/dL (ref 0.00–0.40)
Bilirubin Total: 0.3 mg/dL (ref 0.0–1.2)
TOTAL PROTEIN: 6.2 g/dL (ref 6.0–8.5)

## 2016-12-04 ENCOUNTER — Telehealth: Payer: Self-pay | Admitting: Family Medicine

## 2016-12-04 NOTE — Telephone Encounter (Signed)
Attempted to call back several times but the line is busy.

## 2016-12-13 NOTE — Telephone Encounter (Signed)
The way that we left this with the patient and her sister was that she was to find out from her lawer what he needed from me specifically and I would be glad to write a letter saying that this patient is capable of making the right decisions regarding her needed care at home

## 2016-12-17 NOTE — Telephone Encounter (Signed)
LM for sister to call me so we can make the letter correct

## 2016-12-18 ENCOUNTER — Encounter: Payer: Self-pay | Admitting: Family Medicine

## 2016-12-18 ENCOUNTER — Ambulatory Visit: Payer: Medicare Other | Admitting: Family Medicine

## 2016-12-18 NOTE — Telephone Encounter (Signed)
Letter to be typed and sister to pick up

## 2017-01-21 ENCOUNTER — Other Ambulatory Visit: Payer: Self-pay | Admitting: Family Medicine

## 2017-01-28 DIAGNOSIS — B351 Tinea unguium: Secondary | ICD-10-CM | POA: Diagnosis not present

## 2017-01-28 DIAGNOSIS — I70203 Unspecified atherosclerosis of native arteries of extremities, bilateral legs: Secondary | ICD-10-CM | POA: Diagnosis not present

## 2017-01-28 DIAGNOSIS — M79676 Pain in unspecified toe(s): Secondary | ICD-10-CM | POA: Diagnosis not present

## 2017-02-06 DIAGNOSIS — Z961 Presence of intraocular lens: Secondary | ICD-10-CM | POA: Diagnosis not present

## 2017-02-06 DIAGNOSIS — H348112 Central retinal vein occlusion, right eye, stable: Secondary | ICD-10-CM | POA: Diagnosis not present

## 2017-02-12 ENCOUNTER — Encounter: Payer: Self-pay | Admitting: Family Medicine

## 2017-02-12 ENCOUNTER — Ambulatory Visit (INDEPENDENT_AMBULATORY_CARE_PROVIDER_SITE_OTHER): Payer: Medicare Other | Admitting: Family Medicine

## 2017-02-12 ENCOUNTER — Ambulatory Visit (INDEPENDENT_AMBULATORY_CARE_PROVIDER_SITE_OTHER): Payer: Medicare Other

## 2017-02-12 VITALS — BP 139/78 | HR 84 | Temp 97.4°F | Ht 62.0 in | Wt 119.0 lb

## 2017-02-12 DIAGNOSIS — M25561 Pain in right knee: Secondary | ICD-10-CM | POA: Diagnosis not present

## 2017-02-12 MED ORDER — PREDNISONE 20 MG PO TABS: ORAL_TABLET | ORAL | 0 refills | Status: DC

## 2017-02-12 NOTE — Progress Notes (Signed)
BP 139/78   Pulse 84   Temp 97.4 F (36.3 C) (Oral)   Ht  (1.575 m)   Wt 119 lb (54 kg)   BMI 21.77 kg/m    Subjective:    Patient ID: Courtney Frank, female    DOB: [birth year], 83 y.o.   MRN: [id number]  HPI: JULIE-ANN VANMAANEN is a 83 y.o. female presenting on 02/12/2017 for Right knee pain (patient states she was stepping off of a stool in her kitchen last week and fell)   HPI Right knee pain and swelling Patient comes in today because she has had continued pain in her right knee after falling last week. She said she fell about a week ago when she was trying to get up on a stool to reach the sugar that was up high and when she came back down the stool slipped out from under her and she fell sideways and landed on top of her right knee. She has pain throughout the knee that is low-grade and 5 out of 10 and swelling throughout the knee as well. She is coming in today because the swelling and pain has persisted and does not seem to be improving. It does hurt her to stay on but she is able to stand on it and walk with slight limp. She denies any fevers or chills or redness or warmth.  Relevant past medical, surgical, family and social history reviewed and updated as indicated. Interim medical history since our last visit reviewed. Allergies and medications reviewed and updated.  Review of Systems  Constitutional: Negative for chills and fever.  Respiratory: Negative for chest tightness and shortness of breath.   Cardiovascular: Negative for chest pain and leg swelling.  Musculoskeletal: Positive for arthralgias and joint swelling. Negative for back pain and gait problem.  Skin: Negative for rash.  Neurological: Negative for light-headedness and headaches.  Psychiatric/Behavioral: Negative for agitation and behavioral problems.  All other systems reviewed and are negative.   Per HPI unless specifically indicated above     Objective:    BP 139/78   Pulse 84   Temp 97.4 F  (36.3 C) (Oral)   Ht  (1.575 m)   Wt 119 lb (54 kg)   BMI 21.77 kg/m   Wt Readings from Last 3 Encounters:  02/12/17 119 lb (54 kg)  11/21/16 117 lb (53.1 kg)  09/09/16 120 lb 9.6 oz (54.7 kg)    Physical Exam  Constitutional: She is oriented to person, place, and time. She appears well-developed and well-nourished. No distress.  Eyes: Conjunctivae are normal.  Musculoskeletal: Normal range of motion. She exhibits no edema.       Right knee: She exhibits effusion. She exhibits normal range of motion, no deformity and no LCL laxity. Tenderness found. Medial joint line and lateral joint line tenderness noted.  Neurological: She is alert and oriented to person, place, and time. Coordination normal.  Skin: Skin is warm and dry. No rash noted. She is not diaphoretic.  Psychiatric: She has a normal mood and affect. Her behavior is normal.  Nursing note and vitals reviewed.  Right knee x-ray: No acute bony abnormality, tricompartmental osteoarthritis     Assessment & Plan:   Problem List Items Addressed This Visit    None    Visit Diagnoses    Acute pain of right knee    -  Primary   X-ray was read as normal by radiology, likely just inflammatory versus cartilaginous in origin  Relevant Medications   predniSONE (DELTASONE) 20 MG tablet   Other Relevant Orders   DG Knee 1-2 Views Right (Completed)       Follow up plan: Return if symptoms worsen or fail to improve.  Counseling provided for all of the vaccine components Orders Placed This Encounter  Procedures  . DG Knee 1-2 Views Right    Arville Care, MD [facility] 02/12/2017, 9:44 AM

## 2017-02-19 ENCOUNTER — Encounter: Payer: Self-pay | Admitting: Family Medicine

## 2017-02-19 ENCOUNTER — Ambulatory Visit (INDEPENDENT_AMBULATORY_CARE_PROVIDER_SITE_OTHER): Payer: Medicare Other | Admitting: Family Medicine

## 2017-02-19 VITALS — BP 131/65 | HR 72 | Temp 97.2°F | Ht 62.0 in | Wt 121.0 lb

## 2017-02-19 DIAGNOSIS — E559 Vitamin D deficiency, unspecified: Secondary | ICD-10-CM | POA: Diagnosis not present

## 2017-02-19 DIAGNOSIS — M1711 Unilateral primary osteoarthritis, right knee: Secondary | ICD-10-CM | POA: Diagnosis not present

## 2017-02-19 DIAGNOSIS — E7800 Pure hypercholesterolemia, unspecified: Secondary | ICD-10-CM

## 2017-02-19 DIAGNOSIS — R413 Other amnesia: Secondary | ICD-10-CM

## 2017-02-19 NOTE — Patient Instructions (Addendum)
Medicare Annual Wellness Visit  [place] and the medical providers at [facility] strive to bring you the best medical care.  In doing so we not only want to address your current medical conditions and concerns but also to detect new conditions early and prevent illness, disease and health-related problems.    Medicare offers a yearly Wellness Visit which allows our clinical staff to assess your need for preventative services including immunizations, lifestyle education, counseling to decrease risk of preventable diseases and screening for fall risk and other medical concerns.    This visit is provided free of charge (no copay) for all Medicare recipients. The clinical pharmacists at [facility] have begun to conduct these Wellness Visits which will also include a thorough review of all your medications.    As you primary medical provider recommend that you make an appointment for your Annual Wellness Visit if you have not done so already this year.  You may set up this appointment before you leave today or you may call back ([phone]) and schedule an appointment.  Please make sure when you call that you mention that you are scheduling your Annual Wellness Visit with the clinical pharmacist so that the appointment may be made for the proper length of time.     Continue current medications. Continue good therapeutic lifestyle changes which include good diet and exercise. Fall precautions discussed with patient. If an FOBT was given today- please return it to our front desk. If you are over 55 years old - you may need Prevnar 13 or the adult Pneumonia vaccine.  **Flu shots are available--- please call and schedule a FLU-CLINIC appointment**  After your visit with [place] today you will receive a survey in the mail or online from [employer] regarding your care with [place]. Please take a moment to fill this out. Your feedback is very  important to [place] as you can help [place] better understand your patient needs as well as improve your experience and satisfaction. WE CARE ABOUT YOU!!!   We will arrange for you to see the orthopedic surgeon for follow-up of the right knee with possible injection. Continue to be careful and do not put yourself at risk for falling Do not do any climbing. Happy birthday!!

## 2017-02-19 NOTE — Progress Notes (Signed)
Subjective:    Patient ID: Courtney Frank, female    DOB: Feb 03, 1915, 83 y.o.   MRN: [id number]  HPI Pt here for follow up and management of chronic medical problems which includes hyperlipidemia. She is taking medications regularly.The patient comes to the visit today with her caregiver. She has an FOBT at home that she needs to return and will get lab work today. She will also to check her right knee and check her hernia. Her vital signs are stable and her weight is up a couple pounds. The patient denies any chest pain shortness of breath trouble with her stomach including nausea vomiting diarrhea or blood in the stool. She denies any trouble passing her water. She is pleasant and alert and seems to be happier with the financial situation she has worked out for herself with her sister with providing health care at home for her. He is going to turn 83 years of age this weekend.    Patient Active Problem List   Diagnosis Date Noted  . Retinal hemorrhage noted on examination, right 09/07/2014  . Osteoporosis 03/09/2014  . Vitamin D deficiency 11/16/2013  . Nocturnal leg cramps 11/16/2013  . Insomnia 11/16/2013  . Urine frequency 08/04/2013  . Need for prophylactic vaccination against Streptococcus pneumoniae (pneumococcus) 08/04/2013  . History of GI bleed   . Hyperlipidemia   . Incontinence   . Back pain   . Cataracts, bilateral   . Inguinal hernia    Outpatient Encounter Prescriptions as of 02/19/2017  Medication Sig  . acetaminophen (TYLENOL) 325 MG tablet Take 650 mg by mouth every 6 (six) hours as needed.    Marland Kitchen BETIMOL 0.5 % ophthalmic solution   . busPIRone (BUSPAR) 10 MG tablet Take 1 tablet (10 mg total) by mouth at bedtime.  . calcium carbonate (OS-CAL) 600 MG TABS Take 600 mg by mouth 2 (two) times daily with a meal.    . Cholecalciferol (VITAMIN D3) 1000 UNITS CAPS Take 1 capsule by mouth daily.    Marland Kitchen ezetimibe (ZETIA) 10 MG tablet TAKE ONE TABLET BY MOUTH ONCE DAILY  .  Melatonin 3 MG TABS Take 1 tablet by mouth at bedtime.  Marland Kitchen omeprazole (PRILOSEC) 40 MG capsule TAKE ONE CAPSULE BY MOUTH ONCE DAILY 1  HOUR  BEFORE  A  MEAL  . oxybutynin (DITROPAN-XL) 10 MG 24 hr tablet TAKE ONE TABLET BY MOUTH ONCE DAILY  . raloxifene (EVISTA) 60 MG tablet TAKE ONE TABLET BY MOUTH ONCE DAILY  . simvastatin (ZOCOR) 80 MG tablet TAKE ONE TABLET BY MOUTH ONCE DAILY  . [DISCONTINUED] oxybutynin (DITROPAN-XL) 10 MG 24 hr tablet TAKE ONE TABLET BY MOUTH ONCE DAILY  . [DISCONTINUED] predniSONE (DELTASONE) 20 MG tablet 2 po at same time daily for 5 days   No facility-administered encounter medications on file as of 02/19/2017.       Review of Systems  Constitutional: Negative.   HENT: Negative.   Eyes: Negative.   Respiratory: Negative.   Cardiovascular: Negative.   Gastrointestinal: Negative.        Hernia -left lower abd area  Endocrine: Negative.   Genitourinary: Negative.   Musculoskeletal: Positive for arthralgias (right knee).  Skin: Negative.   Allergic/Immunologic: Negative.   Neurological: Negative.   Hematological: Negative.   Psychiatric/Behavioral: Negative.        Objective:   Physical Exam  Constitutional: She is oriented to person, place, and time. She appears well-developed and well-nourished. No distress.  HENT:  Head: Normocephalic and atraumatic.  Right Ear: External ear normal.  Left Ear: External ear normal.  Nose: Nose normal.  Mouth/Throat: Oropharynx is clear and moist. No oropharyngeal exudate.  Eyes: Conjunctivae and EOM are normal. Pupils are equal, round, and reactive to light. Right eye exhibits no discharge. Left eye exhibits no discharge. No scleral icterus.  Neck: Normal range of motion. Neck supple. No thyromegaly present.  Cardiovascular: Normal rate, regular rhythm, normal heart sounds and intact distal pulses.   No murmur heard. The heart is 72/m with a regular rate and rhythm  Pulmonary/Chest: Effort normal and breath sounds  normal. No respiratory distress. She has no wheezes. She has no rales.  Clear anteriorly and posteriorly  Abdominal: Soft. Bowel sounds are normal. She exhibits no mass. There is no tenderness. There is no rebound and no guarding.  No abdominal tenderness masses or bruits. She has the ongoing hernia that she's had for years and is currently not giving her any major problems other than her being aware that it is there.  Musculoskeletal: Normal range of motion. She exhibits tenderness. She exhibits no edema.  Patient is tender at the bilateral joint line of the right knee and the knee does appear to be more swollen and have more warm than the left knee. She has seen orthopedic surgeon in the past about this and we will schedule her for a visit to see him again for possible injection and the need to give her more relief with her knee pain.  Lymphadenopathy:    She has no cervical adenopathy.  Neurological: She is alert and oriented to person, place, and time. She has normal reflexes. No cranial nerve deficit.  Skin: Skin is warm and dry. No rash noted.  Psychiatric: She has a normal mood and affect. Her behavior is normal. Judgment and thought content normal.  She is pleasant and does have some mild memory impairment but very good for her age of 89.  Nursing note and vitals reviewed.   BP 131/65 (BP Location: Right Arm)   Pulse 72   Temp 97.2 F (36.2 C) (Oral)   Ht '5\' 2"'$  (1.575 m)   Wt 121 lb (54.9 kg)   BMI 22.13 kg/m        Assessment & Plan:  1. Pure hypercholesterolemia -Continue with current medicine pending results of lab work - BMP8+EGFR - CBC with Differential/Platelet - Lipid panel - Hepatic function panel  2. Vitamin D deficiency -Continue with current medicine pending results of lab work - CBC with Differential/Platelet - VITAMIN D 25 Hydroxy (Vit-D Deficiency, Fractures)  3. Memory impairment of gradual onset -Continue with regular routine activity and visual and  hearing stimulation. - CBC with Differential/Platelet   4. Osteoarthritis right knee -Arrange for a visit with orthopedic surgeon and continue to take Tylenol as needed for pain until that visit occurs.   Patient Instructions                       Medicare Annual Wellness Visit  [place] and the medical providers at [facility] strive to bring you the best medical care.  In doing so we not only want to address your current medical conditions and concerns but also to detect new conditions early and prevent illness, disease and health-related problems.    Medicare offers a yearly Wellness Visit which allows our clinical staff to assess your need for preventative services including immunizations, lifestyle education, counseling to decrease risk of preventable diseases and screening  for fall risk and other medical concerns.    This visit is provided free of charge (no copay) for all Medicare recipients. The clinical pharmacists at [facility] have begun to conduct these Wellness Visits which will also include a thorough review of all your medications.    As you primary medical provider recommend that you make an appointment for your Annual Wellness Visit if you have not done so already this year.  You may set up this appointment before you leave today or you may call back ([phone]) and schedule an appointment.  Please make sure when you call that you mention that you are scheduling your Annual Wellness Visit with the clinical pharmacist so that the appointment may be made for the proper length of time.     Continue current medications. Continue good therapeutic lifestyle changes which include good diet and exercise. Fall precautions discussed with patient. If an FOBT was given today- please return it to our front desk. If you are over 47 years old - you may need Prevnar 53 or the adult Pneumonia vaccine.  **Flu shots are available--- please call  and schedule a FLU-CLINIC appointment**  After your visit with [place] today you will receive a survey in the mail or online from [employer] regarding your care with [place]. Please take a moment to fill this out. Your feedback is very important to [place] as you can help [place] better understand your patient needs as well as improve your experience and satisfaction. WE CARE ABOUT YOU!!!     Arrie Senate MD

## 2017-02-20 LAB — CBC WITH DIFFERENTIAL/PLATELET
Basophils Absolute: 0 10*3/uL (ref 0.0–0.2)
Basos: 0 %
EOS (ABSOLUTE): 0 10*3/uL (ref 0.0–0.4)
Eos: 1 %
HEMOGLOBIN: 13.2 g/dL (ref 11.1–15.9)
Hematocrit: 41 % (ref 34.0–46.6)
IMMATURE GRANS (ABS): 0 10*3/uL (ref 0.0–0.1)
IMMATURE GRANULOCYTES: 0 %
LYMPHS: 22 %
Lymphocytes Absolute: 0.9 10*3/uL (ref 0.7–3.1)
MCH: 30.6 pg (ref 26.6–33.0)
MCHC: 32.2 g/dL (ref 31.5–35.7)
MCV: 95 fL (ref 79–97)
MONOCYTES: 9 %
Monocytes Absolute: 0.4 10*3/uL (ref 0.1–0.9)
NEUTROS ABS: 2.9 10*3/uL (ref 1.4–7.0)
NEUTROS PCT: 68 %
Platelets: 168 10*3/uL (ref 150–379)
RBC: 4.32 x10E6/uL (ref 3.77–5.28)
RDW: 14.1 % (ref 12.3–15.4)
WBC: 4.2 10*3/uL (ref 3.4–10.8)

## 2017-02-20 LAB — LIPID PANEL
CHOLESTEROL TOTAL: 141 mg/dL (ref 100–199)
Chol/HDL Ratio: 2.5 ratio (ref 0.0–4.4)
HDL: 56 mg/dL (ref >39)
LDL CALC: 56 mg/dL (ref 0–99)
TRIGLYCERIDES: 144 mg/dL (ref 0–149)
VLDL CHOLESTEROL CAL: 29 mg/dL (ref 5–40)

## 2017-02-20 LAB — BMP8+EGFR
BUN/Creatinine Ratio: 21 (ref 12–28)
BUN: 17 mg/dL (ref 10–36)
CHLORIDE: 102 mmol/L (ref 96–106)
CO2: 27 mmol/L (ref 18–29)
Calcium: 9 mg/dL (ref 8.7–10.3)
Creatinine, Ser: 0.81 mg/dL (ref 0.57–1.00)
GFR, EST AFRICAN AMERICAN: 69 mL/min/{1.73_m2} (ref >59)
GFR, EST NON AFRICAN AMERICAN: 60 mL/min/{1.73_m2} (ref >59)
Glucose: 112 mg/dL — ABNORMAL HIGH (ref 65–99)
Potassium: 3.7 mmol/L (ref 3.5–5.2)
Sodium: 142 mmol/L (ref 134–144)

## 2017-02-20 LAB — HEPATIC FUNCTION PANEL
ALT: 10 IU/L (ref 0–32)
AST: 19 IU/L (ref 0–40)
Albumin: 3.6 g/dL (ref 3.2–4.6)
Alkaline Phosphatase: 76 IU/L (ref 39–117)
Bilirubin Total: 0.2 mg/dL (ref 0.0–1.2)
Bilirubin, Direct: 0.06 mg/dL (ref 0.00–0.40)
Total Protein: 5.7 g/dL — ABNORMAL LOW (ref 6.0–8.5)

## 2017-02-20 LAB — VITAMIN D 25 HYDROXY (VIT D DEFICIENCY, FRACTURES): Vit D, 25-Hydroxy: 39.7 ng/mL (ref 30.0–100.0)

## 2017-04-23 DIAGNOSIS — H02401 Unspecified ptosis of right eyelid: Secondary | ICD-10-CM | POA: Diagnosis not present

## 2017-04-23 DIAGNOSIS — H4311 Vitreous hemorrhage, right eye: Secondary | ICD-10-CM | POA: Diagnosis not present

## 2017-04-23 DIAGNOSIS — H18421 Band keratopathy, right eye: Secondary | ICD-10-CM | POA: Diagnosis not present

## 2017-04-23 DIAGNOSIS — T8529XS Other mechanical complication of intraocular lens, sequela: Secondary | ICD-10-CM | POA: Diagnosis not present

## 2017-04-23 DIAGNOSIS — H34811 Central retinal vein occlusion, right eye, with macular edema: Secondary | ICD-10-CM | POA: Diagnosis not present

## 2017-04-23 DIAGNOSIS — Z961 Presence of intraocular lens: Secondary | ICD-10-CM | POA: Diagnosis not present

## 2017-04-23 DIAGNOSIS — Z9842 Cataract extraction status, left eye: Secondary | ICD-10-CM | POA: Diagnosis not present

## 2017-04-23 DIAGNOSIS — Z9841 Cataract extraction status, right eye: Secondary | ICD-10-CM | POA: Diagnosis not present

## 2017-04-23 DIAGNOSIS — H47292 Other optic atrophy, left eye: Secondary | ICD-10-CM | POA: Diagnosis not present

## 2017-04-23 DIAGNOSIS — Z888 Allergy status to other drugs, medicaments and biological substances status: Secondary | ICD-10-CM | POA: Diagnosis not present

## 2017-05-06 ENCOUNTER — Other Ambulatory Visit: Payer: Self-pay | Admitting: Family Medicine

## 2017-05-20 ENCOUNTER — Encounter: Payer: Self-pay | Admitting: *Deleted

## 2017-05-27 DIAGNOSIS — H18421 Band keratopathy, right eye: Secondary | ICD-10-CM | POA: Diagnosis not present

## 2017-05-27 DIAGNOSIS — T8529XS Other mechanical complication of intraocular lens, sequela: Secondary | ICD-10-CM | POA: Diagnosis not present

## 2017-05-27 DIAGNOSIS — H4311 Vitreous hemorrhage, right eye: Secondary | ICD-10-CM | POA: Diagnosis not present

## 2017-05-27 DIAGNOSIS — H34811 Central retinal vein occlusion, right eye, with macular edema: Secondary | ICD-10-CM | POA: Diagnosis not present

## 2017-05-27 DIAGNOSIS — H44511 Absolute glaucoma, right eye: Secondary | ICD-10-CM | POA: Diagnosis not present

## 2017-06-09 ENCOUNTER — Other Ambulatory Visit: Payer: Self-pay | Admitting: Family Medicine

## 2017-07-07 ENCOUNTER — Ambulatory Visit: Payer: Medicare Other | Admitting: Family Medicine

## 2017-07-09 ENCOUNTER — Ambulatory Visit: Payer: Medicare Other | Admitting: Family Medicine

## 2017-07-10 ENCOUNTER — Encounter: Payer: Self-pay | Admitting: Family Medicine

## 2017-07-31 DIAGNOSIS — I70203 Unspecified atherosclerosis of native arteries of extremities, bilateral legs: Secondary | ICD-10-CM | POA: Diagnosis not present

## 2017-07-31 DIAGNOSIS — B351 Tinea unguium: Secondary | ICD-10-CM | POA: Diagnosis not present

## 2017-07-31 DIAGNOSIS — L84 Corns and callosities: Secondary | ICD-10-CM | POA: Diagnosis not present

## 2017-07-31 DIAGNOSIS — M79676 Pain in unspecified toe(s): Secondary | ICD-10-CM | POA: Diagnosis not present

## 2017-09-02 ENCOUNTER — Ambulatory Visit: Payer: Medicare Other | Admitting: Family Medicine

## 2017-09-03 DIAGNOSIS — H18421 Band keratopathy, right eye: Secondary | ICD-10-CM | POA: Diagnosis not present

## 2017-09-03 DIAGNOSIS — T8529XS Other mechanical complication of intraocular lens, sequela: Secondary | ICD-10-CM | POA: Diagnosis not present

## 2017-09-03 DIAGNOSIS — H4311 Vitreous hemorrhage, right eye: Secondary | ICD-10-CM | POA: Diagnosis not present

## 2017-09-03 DIAGNOSIS — H34811 Central retinal vein occlusion, right eye, with macular edema: Secondary | ICD-10-CM | POA: Diagnosis not present

## 2017-09-03 DIAGNOSIS — H44511 Absolute glaucoma, right eye: Secondary | ICD-10-CM | POA: Diagnosis not present

## 2017-09-08 ENCOUNTER — Other Ambulatory Visit: Payer: Self-pay | Admitting: Family Medicine

## 2017-09-09 NOTE — Telephone Encounter (Signed)
OV 09/24/17

## 2017-09-24 ENCOUNTER — Ambulatory Visit (INDEPENDENT_AMBULATORY_CARE_PROVIDER_SITE_OTHER): Payer: Medicare Other | Admitting: Family Medicine

## 2017-09-24 ENCOUNTER — Ambulatory Visit (INDEPENDENT_AMBULATORY_CARE_PROVIDER_SITE_OTHER): Payer: Medicare Other

## 2017-09-24 ENCOUNTER — Encounter: Payer: Self-pay | Admitting: Family Medicine

## 2017-09-24 VITALS — BP 123/71 | HR 60 | Temp 96.7°F | Ht 62.0 in | Wt 107.0 lb

## 2017-09-24 DIAGNOSIS — Z23 Encounter for immunization: Secondary | ICD-10-CM

## 2017-09-24 DIAGNOSIS — E559 Vitamin D deficiency, unspecified: Secondary | ICD-10-CM

## 2017-09-24 DIAGNOSIS — E7800 Pure hypercholesterolemia, unspecified: Secondary | ICD-10-CM | POA: Diagnosis not present

## 2017-09-24 DIAGNOSIS — J449 Chronic obstructive pulmonary disease, unspecified: Secondary | ICD-10-CM | POA: Diagnosis not present

## 2017-09-24 DIAGNOSIS — Z0000 Encounter for general adult medical examination without abnormal findings: Secondary | ICD-10-CM

## 2017-09-24 DIAGNOSIS — I700 Atherosclerosis of aorta: Secondary | ICD-10-CM | POA: Insufficient documentation

## 2017-09-24 NOTE — Patient Instructions (Addendum)
Medicare Annual Wellness Visit  [place] and the medical providers at [facility] strive to bring you the best medical care.  In doing so we not only want to address your current medical conditions and concerns but also to detect new conditions early and prevent illness, disease and health-related problems.    Medicare offers a yearly Wellness Visit which allows our clinical staff to assess your need for preventative services including immunizations, lifestyle education, counseling to decrease risk of preventable diseases and screening for fall risk and other medical concerns.    This visit is provided free of charge (no copay) for all Medicare recipients. The clinical pharmacists at [facility] have begun to conduct these Wellness Visits which will also include a thorough review of all your medications.    As you primary medical provider recommend that you make an appointment for your Annual Wellness Visit if you have not done so already this year.  You may set up this appointment before you leave today or you may call back [phone] and schedule an appointment.  Please make sure when you call that you mention that you are scheduling your Annual Wellness Visit with the clinical pharmacist so that the appointment may be made for the proper length of time.     Continue current medications. Continue good therapeutic lifestyle changes which include good diet and exercise. Fall precautions discussed with patient. If an FOBT was given today- please return it to our front desk. If you are over 81 years old - you may need Prevnar 13 or the adult Pneumonia vaccine.  **Flu shots are available--- please call and schedule a FLU-CLINIC appointment**  After your visit with us today you will receive a survey in the mail or online from [employer] regarding your care with us. Please take a moment to fill this out. Your feedback is very  important to us as you can help us better understand your patient needs as well as improve your experience and satisfaction. WE CARE ABOUT YOU!!!  We will call with lab work results as soon as those results become available We will call with a chest x-ray results as soon as that becomes available Please return the FOBT The patient should be encouraged to drink plenty of fluids and to stay well-hydrated She should be reminded to wear a pad if the incontinence is a problem.

## 2017-09-24 NOTE — Progress Notes (Signed)
Subjective:    Patient ID: Courtney Frank, female    DOB: Jun 17, 1915, 83 y.o.   MRN: [id number]  HPI  Pt here for follow up and management of chronic medical problems which includes hyperlipidemia. She is taking medication regularly.  Patient is doing well overall and comes to the visit today with her caregiver.  The caregiver indicates that she is having some incontinence at times but refuses to wear a pad for this.  She has no other complaints.  She will get a chest x-ray today and will be given an FOBT to return along with getting lab work today.  She will also get her flu shot today.  She is up-to-date on all of her other immunizations.  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  She continues to see the ophthalmologist at [facility] every 4-5 months.  She has no vision from that eye.  The caregiver comes with her and neither the caregiver nor the patient have any complaints with chest pain shortness of breath trouble swallowing heartburn indigestion nausea vomiting diarrhea or blood in the stool.  She does have the incontinence as mentioned and she wears a pad when she goes out.  Unfortunately she did lose her 70 year old son Eulas Post, with prostate cancer in June and she did not eat well for several weeks after that but her appetite has picked back up.  She has 1 other son who has power of attorney and he lives in [place].  A sister's child or her niece takes care of her locally with any specific needs.  Her caregiver's name is Horris Latino and she is very sweet and relays a history well if the patient does not.    Patient Active Problem List   Diagnosis Date Noted  . Retinal hemorrhage noted on examination, right 09/07/2014  . Osteoporosis 03/09/2014  . Vitamin D deficiency 11/16/2013  . Nocturnal leg cramps 11/16/2013  . Insomnia 11/16/2013  . Urine frequency 08/04/2013  . Need for prophylactic  vaccination against Streptococcus pneumoniae (pneumococcus) 08/04/2013  . History of GI bleed   . Hyperlipidemia   . Incontinence   . Back pain   . Cataracts, bilateral   . Inguinal hernia    Outpatient Encounter Medications as of 09/24/2017  Medication Sig  . acetaminophen (TYLENOL) 325 MG tablet Take 650 mg by mouth every 6 (six) hours as needed.    Marland Kitchen BETIMOL 0.5 % ophthalmic solution   . busPIRone (BUSPAR) 10 MG tablet Take 1 tablet (10 mg total) by mouth at bedtime.  . calcium carbonate (OS-CAL) 600 MG TABS Take 600 mg by mouth 2 (two) times daily with a meal.    . Cholecalciferol (VITAMIN D3) 1000 UNITS CAPS Take 1 capsule by mouth daily.    Marland Kitchen ezetimibe (ZETIA) 10 MG tablet TAKE ONE TABLET BY MOUTH ONCE DAILY  . Melatonin 3 MG TABS Take 1 tablet by mouth at bedtime.  Marland Kitchen omeprazole (PRILOSEC) 40 MG capsule TAKE ONE CAPSULE BY MOUTH ONCE DAILY 1  HOUR  BEFORE  A  MEAL  . oxybutynin (DITROPAN-XL) 10 MG 24 hr tablet TAKE ONE TABLET BY MOUTH ONCE DAILY  . raloxifene (EVISTA) 60 MG tablet TAKE ONE TABLET BY MOUTH ONCE DAILY  . simvastatin (ZOCOR) 80 MG tablet TAKE ONE TABLET BY MOUTH ONCE DAILY  . [DISCONTINUED] omeprazole (PRILOSEC) 40 MG capsule TAKE ONE CAPSULE BY MOUTH ONCE DAILY 1  HOUR  BEFORE  A  MEAL  . [DISCONTINUED] oxybutynin (DITROPAN-XL) 10 MG 24 hr tablet TAKE 1 TABLET BY MOUTH ONCE DAILY  . [DISCONTINUED] oxybutynin (DITROPAN-XL) 10 MG 24 hr tablet TAKE ONE TABLET BY MOUTH ONCE DAILY  . [DISCONTINUED] raloxifene (EVISTA) 60 MG tablet TAKE ONE TABLET BY MOUTH ONCE DAILY  . [DISCONTINUED] raloxifene (EVISTA) 60 MG tablet TAKE ONE TABLET BY MOUTH ONCE DAILY   No facility-administered encounter medications on file as of 09/24/2017.      Review of Systems  Constitutional: Negative.   HENT: Negative.   Eyes: Negative.   Respiratory: Negative.   Cardiovascular: Negative.   Gastrointestinal: Negative.   Endocrine: Negative.   Genitourinary: Negative.   Musculoskeletal:  Negative.   Skin: Negative.   Allergic/Immunologic: Negative.   Neurological: Negative.   Hematological: Negative.   Psychiatric/Behavioral: Negative.        Objective:   Physical Exam  Constitutional: She is oriented to person, place, and time. She appears well-developed and well-nourished. No distress.  The patient is elderly but alert.  She has a keen sense of humor and responds appropriately to joking questions asked of her.  HENT:  Head: Normocephalic and atraumatic.  Right Ear: External ear normal.  Left Ear: External ear normal.  Nose: Nose normal.  Mouth/Throat: Oropharynx is clear and moist. No oropharyngeal exudate.  Eyes: Conjunctivae and EOM are normal. Pupils are equal, round, and reactive to light. Right eye exhibits no discharge. Left eye exhibits no discharge. No scleral icterus.  She continues to follow-up regularly with the ophthalmologist for her loss of vision in the left eye.  She is currently using drops prescribed by the ophthalmologist.  Neck: Normal range of motion. Neck supple. No thyromegaly present.  No bruits thyromegaly or anterior cervical adenopathy  Cardiovascular: Normal rate, regular rhythm, normal heart sounds and intact distal pulses.  No murmur heard. Heart is regular at 60/min  Pulmonary/Chest: Effort normal and breath sounds normal. No respiratory distress. She has no wheezes. She has no rales.  Abdominal: Soft. Bowel sounds are normal. She exhibits no mass. There is no tenderness. There is no rebound and no guarding.  The abdomen is soft without masses organ enlargement or bruits  Genitourinary:  Genitourinary Comments: Both breasts were checked and there were no masses and no axillary adenopathy  Musculoskeletal: Normal range of motion. She exhibits no edema.  Lymphadenopathy:    She has no cervical adenopathy.  Neurological: She is alert and oriented to person, place, and time. She has normal reflexes. No cranial nerve deficit.  Skin: Skin  is warm and dry. No rash noted.  Psychiatric: She has a normal mood and affect. Her behavior is normal. Judgment and thought content normal.  Nursing note and vitals reviewed.  BP 123/71 (BP Location: Right Arm)   Pulse 60   Temp (!) 96.7 F (35.9 C) (Oral)   Ht _0  (1.575 m)   Wt 107 lb (48.5 kg)   BMI 19.57 kg/m         Assessment & Plan:  1. Pure hypercholesterolemia -Continue with aggressive therapeutic lifestyle changes pending results of lab work - [employer] 2 View; Future - BMP8+EGFR - CBC with Differential/Platelet - Lipid panel - Hepatic function panel  2. Vitamin D deficiency -Continue with vitamin D replacement pending results of lab work - CBC with Differential/Platelet - VITAMIN D 25 Hydroxy (Vit-D Deficiency, Fractures)  3. Health care maintenance -We will call patient's caregiver with results of labs and  x-ray - DG Chest 2 View; Future - CBC with Differential/Platelet Patient Instructions                       Medicare Annual Wellness Visit  [place] and the medical providers at [facility] strive to bring you the best medical care.  In doing so we not only want to address your current medical conditions and concerns but also to detect new conditions early and prevent illness, disease and health-related problems.    Medicare offers a yearly Wellness Visit which allows our clinical staff to assess your need for preventative services including immunizations, lifestyle education, counseling to decrease risk of preventable diseases and screening for fall risk and other medical concerns.    This visit is provided free of charge (no copay) for all Medicare recipients. The clinical pharmacists at [facility] have begun to conduct these Wellness Visits which will also include a thorough review of all your medications.    As you primary medical provider recommend that you make an appointment for your Annual Wellness  Visit if you have not done so already this year.  You may set up this appointment before you leave today or you may call back ([phone]) and schedule an appointment.  Please make sure when you call that you mention that you are scheduling your Annual Wellness Visit with the clinical pharmacist so that the appointment may be made for the proper length of time.     Continue current medications. Continue good therapeutic lifestyle changes which include good diet and exercise. Fall precautions discussed with patient. If an FOBT was given today- please return it to our front desk. If you are over 51 years old - you may need Prevnar 42 or the adult Pneumonia vaccine.  **Flu shots are available--- please call and schedule a FLU-CLINIC appointment**  After your visit with [place] today you will receive a survey in the mail or online from [employer] regarding your care with [place]. Please take a moment to fill this out. Your feedback is very important to [place] as you can help [place] better understand your patient needs as well as improve your experience and satisfaction. WE CARE ABOUT YOU!!!  We will call with lab work results as soon as those results become available We will call with a chest x-ray results as soon as that becomes available Please return the FOBT The patient should be encouraged to drink plenty of fluids and to stay well-hydrated She should be reminded to wear a pad if the incontinence is a problem.    Arrie Senate MD   .

## 2017-09-25 LAB — BMP8+EGFR
BUN/Creatinine Ratio: 16 (ref 12–28)
BUN: 12 mg/dL (ref 10–36)
CALCIUM: 9.1 mg/dL (ref 8.7–10.3)
CHLORIDE: 104 mmol/L (ref 96–106)
CO2: 27 mmol/L (ref 20–29)
Creatinine, Ser: 0.73 mg/dL (ref 0.57–1.00)
GFR calc Af Amer: 78 mL/min/{1.73_m2} (ref >59)
GFR, EST NON AFRICAN AMERICAN: 68 mL/min/{1.73_m2} (ref >59)
GLUCOSE: 86 mg/dL (ref 65–99)
POTASSIUM: 4.5 mmol/L (ref 3.5–5.2)
Sodium: 143 mmol/L (ref 134–144)

## 2017-09-25 LAB — CBC WITH DIFFERENTIAL/PLATELET
BASOS: 1 %
Basophils Absolute: 0 10*3/uL (ref 0.0–0.2)
EOS (ABSOLUTE): 0 10*3/uL (ref 0.0–0.4)
EOS: 1 %
HEMATOCRIT: 39.8 % (ref 34.0–46.6)
HEMOGLOBIN: 13.3 g/dL (ref 11.1–15.9)
IMMATURE GRANULOCYTES: 0 %
Immature Grans (Abs): 0 10*3/uL (ref 0.0–0.1)
Lymphocytes Absolute: 0.8 10*3/uL (ref 0.7–3.1)
Lymphs: 28 %
MCH: 31.8 pg (ref 26.6–33.0)
MCHC: 33.4 g/dL (ref 31.5–35.7)
MCV: 95 fL (ref 79–97)
MONOCYTES: 9 %
MONOS ABS: 0.2 10*3/uL (ref 0.1–0.9)
NEUTROS PCT: 61 %
Neutrophils Absolute: 1.7 10*3/uL (ref 1.4–7.0)
Platelets: 190 10*3/uL (ref 150–379)
RBC: 4.18 x10E6/uL (ref 3.77–5.28)
RDW: 15.6 % — AB (ref 12.3–15.4)
WBC: 2.8 10*3/uL — ABNORMAL LOW (ref 3.4–10.8)

## 2017-09-25 LAB — HEPATIC FUNCTION PANEL
ALT: 11 IU/L (ref 0–32)
AST: 16 IU/L (ref 0–40)
Albumin: 3.6 g/dL (ref 3.2–4.6)
Alkaline Phosphatase: 81 IU/L (ref 39–117)
BILIRUBIN, DIRECT: 0.12 mg/dL (ref 0.00–0.40)
Bilirubin Total: 0.4 mg/dL (ref 0.0–1.2)
TOTAL PROTEIN: 6 g/dL (ref 6.0–8.5)

## 2017-09-25 LAB — LIPID PANEL
CHOL/HDL RATIO: 2.4 ratio (ref 0.0–4.4)
Cholesterol, Total: 154 mg/dL (ref 100–199)
HDL: 63 mg/dL (ref >39)
LDL Calculated: 77 mg/dL (ref 0–99)
Triglycerides: 69 mg/dL (ref 0–149)
VLDL CHOLESTEROL CAL: 14 mg/dL (ref 5–40)

## 2017-09-25 LAB — VITAMIN D 25 HYDROXY (VIT D DEFICIENCY, FRACTURES): VIT D 25 HYDROXY: 36.7 ng/mL (ref 30.0–100.0)

## 2017-10-24 ENCOUNTER — Other Ambulatory Visit: Payer: Self-pay | Admitting: Family Medicine

## 2018-01-09 IMAGING — DX DG CHEST 2V
2 series · 2 of 2 positions shown · non-contrast
Comparison: Chest x-ray of October 25, 2015

CLINICAL DATA: Hypercholesterolemia

EXAM:
CHEST  2 VIEW

[chest pa]
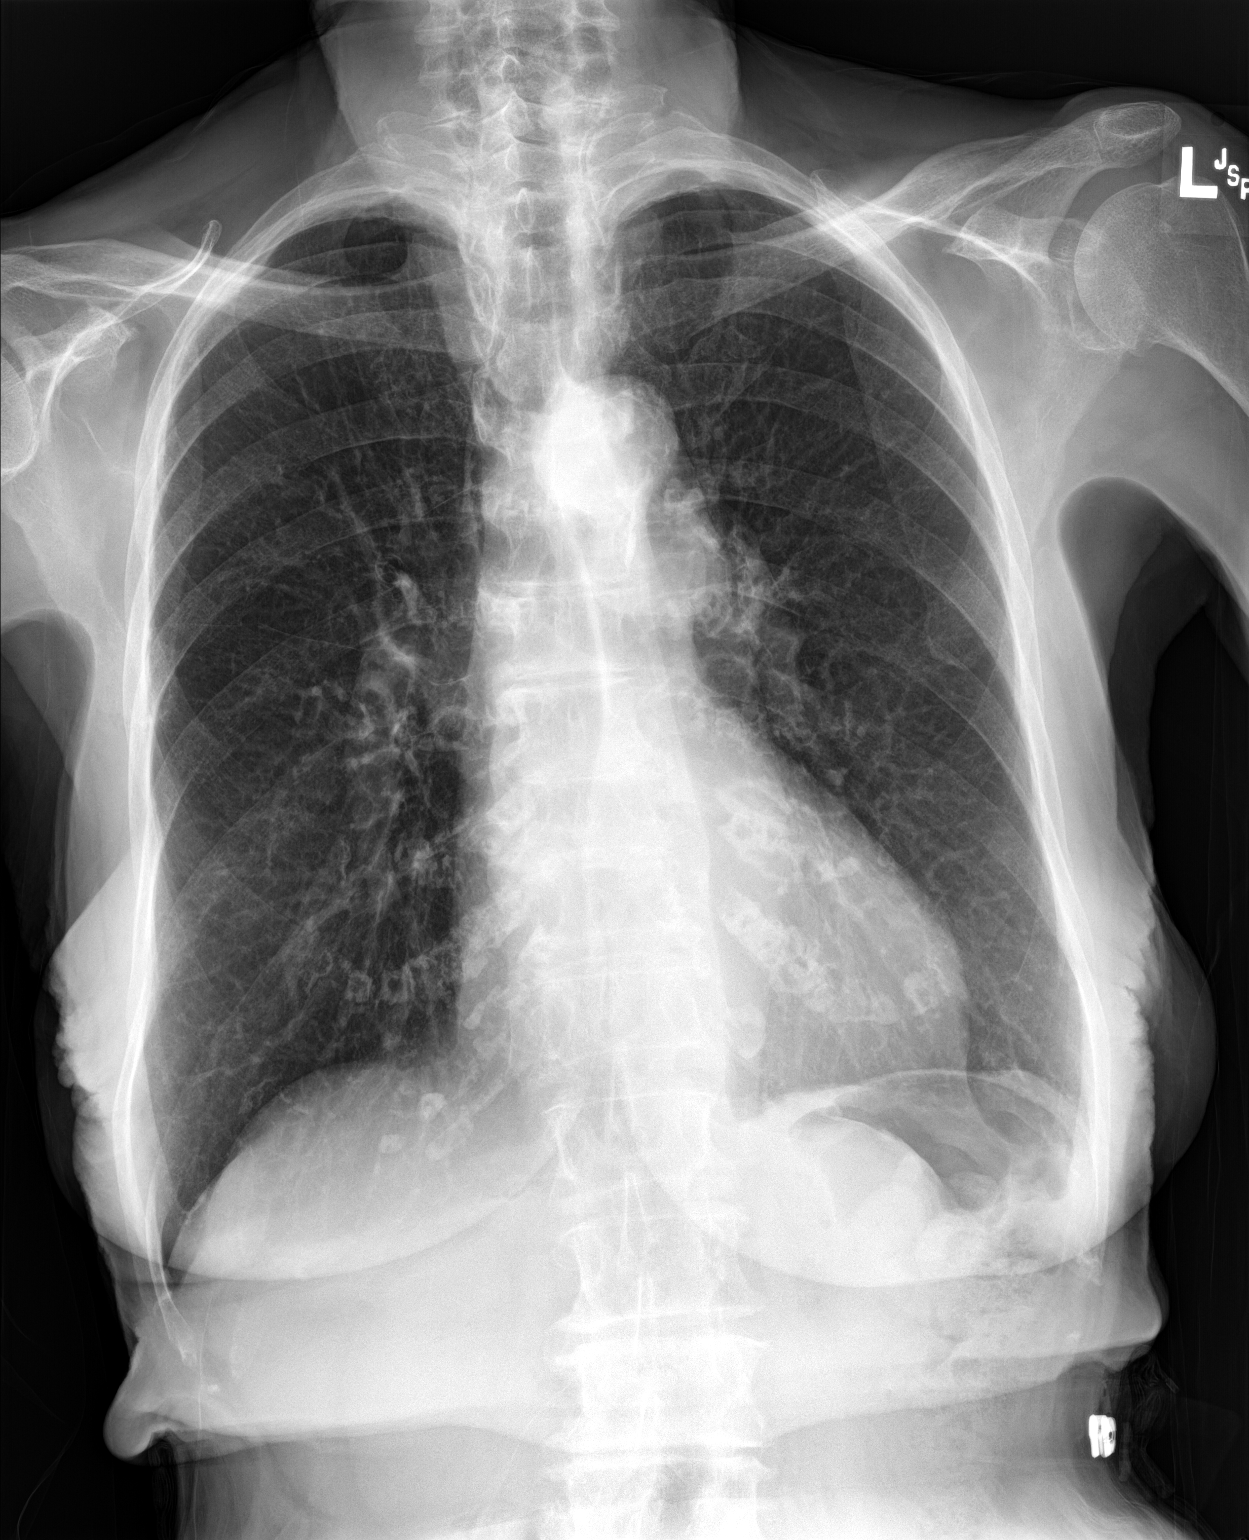

[chest lat]
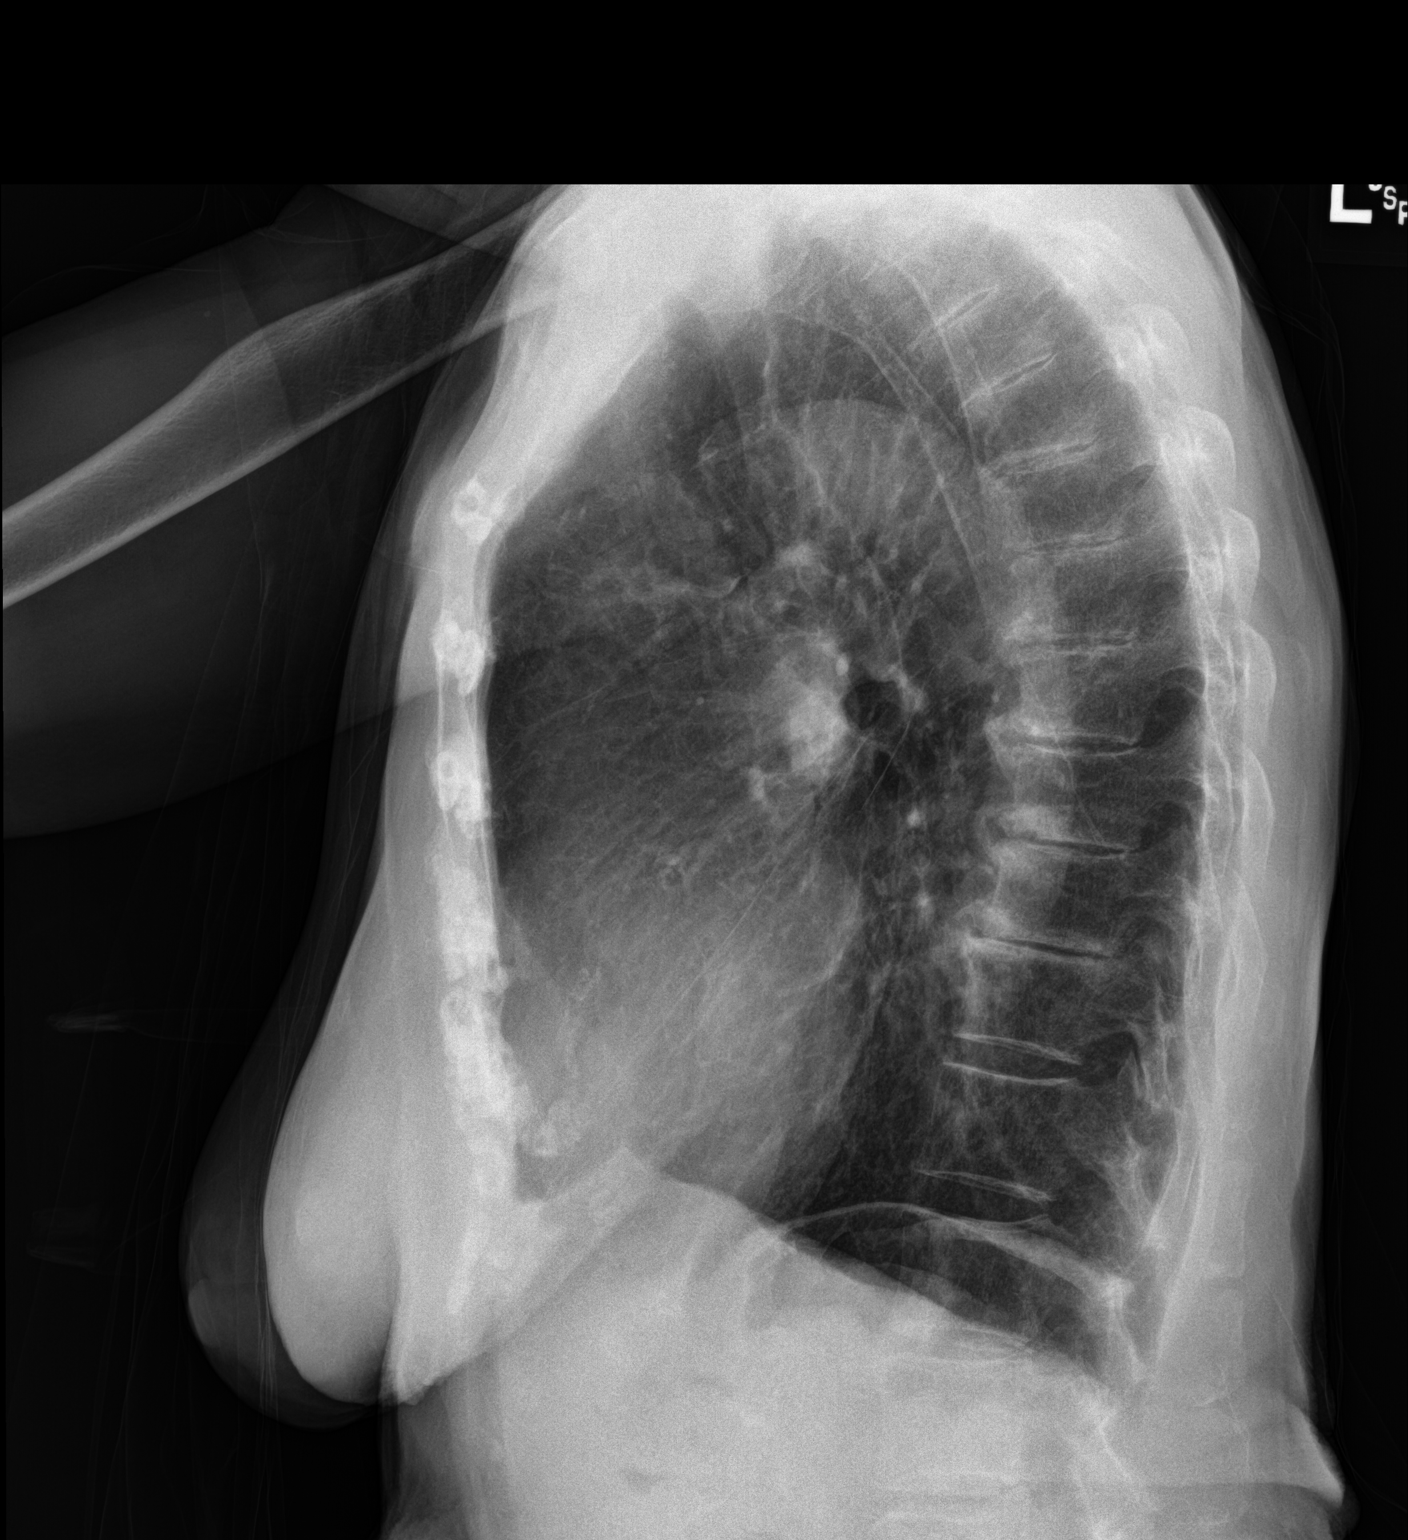

[2 of 2 positions shown; findings below may reference images not displayed]

FINDINGS: The lungs remain hyperinflated with hemidiaphragm flattening. There
is no focal infiltrate. There is no pleural effusion. The heart and
pulmonary vascularity are normal. There is calcification in the wall
of the aortic arch. The trachea is midline. There is multilevel
degenerative disc disease of the thoracic spine.
IMPRESSION: COPD. No pneumonia, CHF, nor other acute cardiopulmonary
abnormality.

Thoracic aortic atherosclerosis.

## 2018-01-27 DIAGNOSIS — B351 Tinea unguium: Secondary | ICD-10-CM | POA: Diagnosis not present

## 2018-01-27 DIAGNOSIS — L84 Corns and callosities: Secondary | ICD-10-CM | POA: Diagnosis not present

## 2018-01-27 DIAGNOSIS — M79676 Pain in unspecified toe(s): Secondary | ICD-10-CM | POA: Diagnosis not present

## 2018-01-27 DIAGNOSIS — I70203 Unspecified atherosclerosis of native arteries of extremities, bilateral legs: Secondary | ICD-10-CM | POA: Diagnosis not present

## 2018-01-28 ENCOUNTER — Encounter: Payer: Self-pay | Admitting: Family Medicine

## 2018-01-28 ENCOUNTER — Ambulatory Visit (INDEPENDENT_AMBULATORY_CARE_PROVIDER_SITE_OTHER): Payer: Medicare Other | Admitting: Family Medicine

## 2018-01-28 VITALS — BP 134/74 | HR 67 | Temp 97.4°F | Ht 62.0 in | Wt 111.0 lb

## 2018-01-28 DIAGNOSIS — K4090 Unilateral inguinal hernia, without obstruction or gangrene, not specified as recurrent: Secondary | ICD-10-CM | POA: Diagnosis not present

## 2018-01-28 DIAGNOSIS — E7800 Pure hypercholesterolemia, unspecified: Secondary | ICD-10-CM

## 2018-01-28 DIAGNOSIS — E559 Vitamin D deficiency, unspecified: Secondary | ICD-10-CM | POA: Diagnosis not present

## 2018-01-28 NOTE — Progress Notes (Signed)
 Subjective:    Patient ID: Courtney Frank, female    DOB: 10/28/1915, 84 y.o.   MRN: [id number]  HPI Pt here for follow up and management of chronic medical problems which includes hyperlipidemia. She is taking medication regularly.  This elderly patient continues to do well.  She is 84 years old.  Her vital signs are stable and her weight is up about 3 or 4 pounds since the last visit.  She has no complaints today and does not need any refills.  Patient is pleasant and doing great and this is confirmed by her caregiver who comes with her today.  The patient's niece is financially responsible to make sure that everything for this patient is being taken care of.  The patient denies any chest pain or shortness of breath.  She denies any problems with her stomach or changes in bowel habits.  She denies any blood in the stool or black tarry bowel movements.  She is passing her water without problems.  She has been to the podiatrist recently and had a corn or callus carb down by the foot doctor and plans to go back and see him again in about 3 months.  The caregiver says her skin condition is good.  She does have a left inguinal hernia but has no problems with this.   Patient Active Problem List   Diagnosis Date Noted  . Thoracic aortic atherosclerosis (HCC) 09/24/2017  . Retinal hemorrhage noted on examination, right 09/07/2014  . Osteoporosis 03/09/2014  . Vitamin D deficiency 11/16/2013  . Nocturnal leg cramps 11/16/2013  . Insomnia 11/16/2013  . Urine frequency 08/04/2013  . Need for prophylactic vaccination against Streptococcus pneumoniae (pneumococcus) 08/04/2013  . History of GI bleed   . Hyperlipidemia   . Incontinence   . Back pain   . Cataracts, bilateral   . Inguinal hernia    Outpatient Encounter Medications as of 01/28/2018  Medication Sig  . acetaminophen (TYLENOL) 325 MG tablet Take 650 mg by mouth every 6 (six) hours as needed.    . BETIMOL 0.5 % ophthalmic solution   .  busPIRone (BUSPAR) 10 MG tablet Take 1 tablet (10 mg total) by mouth at bedtime.  . calcium carbonate (OS-CAL) 600 MG TABS Take 600 mg by mouth 2 (two) times daily with a meal.    . Cholecalciferol (VITAMIN D3) 1000 UNITS CAPS Take 1 capsule by mouth daily.    . ezetimibe (ZETIA) 10 MG tablet TAKE 1 TABLET BY MOUTH ONCE DAILY  . Melatonin 3 MG TABS Take 1 tablet by mouth at bedtime.  . omeprazole (PRILOSEC) 40 MG capsule TAKE ONE CAPSULE BY MOUTH ONCE DAILY 1  HOUR  BEFORE  A  MEAL  . omeprazole (PRILOSEC) 40 MG capsule TAKE ONE CAPSULE BY MOUTH ONCE DAILY 1  HOUR  BEFORE  A  MEAL  . oxybutynin (DITROPAN-XL) 10 MG 24 hr tablet TAKE ONE TABLET BY MOUTH ONCE DAILY  . raloxifene (EVISTA) 60 MG tablet TAKE ONE TABLET BY MOUTH ONCE DAILY  . simvastatin (ZOCOR) 80 MG tablet TAKE 1 TABLET BY MOUTH ONCE DAILY   No facility-administered encounter medications on file as of 01/28/2018.       Review of Systems  Constitutional: Negative.   HENT: Negative.   Eyes: Negative.   Respiratory: Negative.   Cardiovascular: Negative.   Gastrointestinal: Negative.   Endocrine: Negative.   Genitourinary: Negative.   Musculoskeletal: Negative.   Skin: Negative.   Allergic/Immunologic: Negative.   Neurological:   Negative.   Hematological: Negative.   Psychiatric/Behavioral: Negative.        Objective:   Physical Exam  Constitutional: She is oriented to person, place, and time. She appears well-developed and well-nourished. No distress.  Pleasant elderly but alert and responds appropriately to questions asked of her.  HENT:  Head: Normocephalic and atraumatic.  Right Ear: External ear normal.  Left Ear: External ear normal.  Nose: Nose normal.  Mouth/Throat: Oropharynx is clear and moist. No oropharyngeal exudate.  Eyes: Conjunctivae and EOM are normal. Pupils are equal, round, and reactive to light. Right eye exhibits no discharge. Left eye exhibits no discharge. No scleral icterus.  Neck: Normal  range of motion. Neck supple. No thyromegaly present.  No bruits or thyromegaly  Cardiovascular: Normal rate, regular rhythm, normal heart sounds and intact distal pulses.  No murmur heard. Heart is regular at 72/min  Pulmonary/Chest: Effort normal and breath sounds normal. No respiratory distress. She has no wheezes. She has no rales.  Clear anteriorly and posteriorly  Abdominal: Soft. Bowel sounds are normal. She exhibits no mass. There is no tenderness. There is no rebound and no guarding.  No abdominal tenderness masses organ enlargement or bruits.  There is a left inguinal hernia left lower abdominal hernia that is reducible and the patient is not having any symptoms with this.  Musculoskeletal: Normal range of motion. She exhibits no edema.  Lymphadenopathy:    She has no cervical adenopathy.  Neurological: She is alert and oriented to person, place, and time. She has normal reflexes. No cranial nerve deficit.  Skin: Skin is warm and dry. No rash noted.  Psychiatric: She has a normal mood and affect. Her behavior is normal. Judgment and thought content normal.  Nursing note and vitals reviewed.  BP 134/74 (BP Location: Right Arm)   Pulse 67   Temp (!) 97.4 F (36.3 C) (Oral)   Ht 5' 2" (1.575 m)   Wt 111 lb (50.3 kg)   BMI 20.30 kg/m        Assessment & Plan:  1. Vitamin D deficiency -Continue current treatment pending results of lab work - CBC with Differential/Platelet - VITAMIN D 25 Hydroxy (Vit-D Deficiency, Fractures)  2. Pure hypercholesterolemia -Continue current treatment pending results of lab work - CBC with Differential/Platelet - BMP8+EGFR - Lipid panel - Hepatic function panel  3. Left inguinal hernia -Patient and caregiver were reminded that if any severe left lower abdominal pain develop near the hernia that should get to the emergency room immediately.  Patient Instructions                       Medicare Annual Wellness Visit  [place] and the  medical providers at [facility] strive to bring you the best medical care.  In doing so we not only want to address your current medical conditions and concerns but also to detect new conditions early and prevent illness, disease and health-related problems.    Medicare offers a yearly Wellness Visit which allows our clinical staff to assess your need for preventative services including immunizations, lifestyle education, counseling to decrease risk of preventable diseases and screening for fall risk and other medical concerns.    This visit is provided free of charge (no copay) for all Medicare recipients. The clinical pharmacists at [facility] have begun to conduct these Wellness Visits which will also include a thorough review of all your medications.    As   you primary medical provider recommend that you make an appointment for your Annual Wellness Visit if you have not done so already this year.  You may set up this appointment before you leave today or you may call back ([phone]) and schedule an appointment.  Please make sure when you call that you mention that you are scheduling your Annual Wellness Visit with the clinical pharmacist so that the appointment may be made for the proper length of time.     Continue current medications. Continue good therapeutic lifestyle changes which include good diet and exercise. Fall precautions discussed with patient. If an FOBT was given today- please return it to our front desk. If you are over 50 years old - you may need Prevnar 13 or the adult Pneumonia vaccine.  **Flu shots are available--- please call and schedule a FLU-CLINIC appointment**  After your visit with us today you will receive a survey in the mail or online from [employer] regarding your care with us. Please take a moment to fill this out. Your feedback is very important to us as you can help us better understand your patient needs as well  as improve your experience and satisfaction. WE CARE ABOUT YOU!!!  Continue to drink plenty of water and fluids and stay well-hydrated Use cane or walker when possible to keep from falling Stay as active as possible physically and make sure that someone is always with you   Don W. Moore MD   

## 2018-01-28 NOTE — Patient Instructions (Addendum)
Medicare Annual Wellness Visit  [place] and the medical providers at [facility] strive to bring you the best medical care.  In doing so we not only want to address your current medical conditions and concerns but also to detect new conditions early and prevent illness, disease and health-related problems.    Medicare offers a yearly Wellness Visit which allows our clinical staff to assess your need for preventative services including immunizations, lifestyle education, counseling to decrease risk of preventable diseases and screening for fall risk and other medical concerns.    This visit is provided free of charge (no copay) for all Medicare recipients. [facility] have begun to conduct these Wellness Visits which will also include a thorough review of all your medications.    As you primary medical provider recommend that you make an appointment for your Annual Wellness Visit if you have not done so already this year.  You may set up this appointment before you leave today or you may call back [phone] and schedule an appointment.  Please make sure when you call that you mention that you are scheduling your Annual Wellness Visit with the clinical pharmacist so that the appointment may be made for the proper length of time.     Continue current medications. Continue good therapeutic lifestyle changes which include good diet and exercise. Fall precautions discussed with patient. If an FOBT was given today- please return it to our front desk. If you are over 673 years old - you may need Prevnar 13 or the adult Pneumonia vaccine.  **Flu shots are available--- please call and schedule a FLU-CLINIC appointment**  After your visit with us today you will receive a survey in the mail or online from [employer] regarding your care with us. Please take a moment to fill this out. Your feedback is very  important to us as you can help us better understand your patient needs as well as improve your experience and satisfaction. WE CARE ABOUT YOU!!!  Continue to drink plenty of water and fluids and stay well-hydrated Use cane or walker when possible to keep from falling Stay as active as possible physically and make sure that someone is always with you

## 2018-01-29 LAB — BMP8+EGFR
BUN / CREAT RATIO: 19 (ref 12–28)
BUN: 16 mg/dL (ref 10–36)
CALCIUM: 9.5 mg/dL (ref 8.7–10.3)
CHLORIDE: 103 mmol/L (ref 96–106)
CO2: 26 mmol/L (ref 20–29)
CREATININE: 0.85 mg/dL (ref 0.57–1.00)
GFR, EST AFRICAN AMERICAN: 65 mL/min/{1.73_m2} (ref >59)
GFR, EST NON AFRICAN AMERICAN: 56 mL/min/{1.73_m2} — AB (ref >59)
Glucose: 77 mg/dL (ref 65–99)
Potassium: 4.2 mmol/L (ref 3.5–5.2)
Sodium: 143 mmol/L (ref 134–144)

## 2018-01-29 LAB — CBC WITH DIFFERENTIAL/PLATELET
BASOS: 1 %
Basophils Absolute: 0 10*3/uL (ref 0.0–0.2)
EOS (ABSOLUTE): 0 10*3/uL (ref 0.0–0.4)
EOS: 1 %
HEMATOCRIT: 41.8 % (ref 34.0–46.6)
HEMOGLOBIN: 13.5 g/dL (ref 11.1–15.9)
IMMATURE GRANS (ABS): 0 10*3/uL (ref 0.0–0.1)
IMMATURE GRANULOCYTES: 0 %
LYMPHS: 27 %
Lymphocytes Absolute: 1 10*3/uL (ref 0.7–3.1)
MCH: 31.4 pg (ref 26.6–33.0)
MCHC: 32.3 g/dL (ref 31.5–35.7)
MCV: 97 fL (ref 79–97)
Monocytes Absolute: 0.4 10*3/uL (ref 0.1–0.9)
Monocytes: 10 %
NEUTROS ABS: 2.3 10*3/uL (ref 1.4–7.0)
NEUTROS PCT: 61 %
Platelets: 184 10*3/uL (ref 150–379)
RBC: 4.3 x10E6/uL (ref 3.77–5.28)
RDW: 14.5 % (ref 12.3–15.4)
WBC: 3.6 10*3/uL (ref 3.4–10.8)

## 2018-01-29 LAB — HEPATIC FUNCTION PANEL
ALK PHOS: 84 IU/L (ref 39–117)
ALT: 12 IU/L (ref 0–32)
AST: 18 IU/L (ref 0–40)
Albumin: 3.6 g/dL (ref 3.2–4.6)
BILIRUBIN TOTAL: 0.3 mg/dL (ref 0.0–1.2)
BILIRUBIN, DIRECT: 0.1 mg/dL (ref 0.00–0.40)
Total Protein: 6.1 g/dL (ref 6.0–8.5)

## 2018-01-29 LAB — LIPID PANEL
CHOLESTEROL TOTAL: 155 mg/dL (ref 100–199)
Chol/HDL Ratio: 2.3 ratio (ref 0.0–4.4)
HDL: 66 mg/dL (ref >39)
LDL Calculated: 72 mg/dL (ref 0–99)
TRIGLYCERIDES: 86 mg/dL (ref 0–149)
VLDL CHOLESTEROL CAL: 17 mg/dL (ref 5–40)

## 2018-01-29 LAB — VITAMIN D 25 HYDROXY (VIT D DEFICIENCY, FRACTURES): Vit D, 25-Hydroxy: 35.2 ng/mL (ref 30.0–100.0)

## 2018-02-09 ENCOUNTER — Other Ambulatory Visit: Payer: Self-pay | Admitting: Family Medicine

## 2018-02-24 ENCOUNTER — Other Ambulatory Visit: Payer: Self-pay | Admitting: Family Medicine

## 2018-03-18 ENCOUNTER — Telehealth: Payer: Self-pay | Admitting: Family Medicine

## 2018-03-18 ENCOUNTER — Encounter: Payer: Self-pay | Admitting: *Deleted

## 2018-03-18 NOTE — Telephone Encounter (Signed)
Family aware - note

## 2018-04-10 ENCOUNTER — Ambulatory Visit (INDEPENDENT_AMBULATORY_CARE_PROVIDER_SITE_OTHER): Payer: Medicare Other

## 2018-04-10 ENCOUNTER — Encounter: Payer: Self-pay | Admitting: Family Medicine

## 2018-04-10 ENCOUNTER — Ambulatory Visit (INDEPENDENT_AMBULATORY_CARE_PROVIDER_SITE_OTHER): Payer: Medicare Other | Admitting: Family Medicine

## 2018-04-10 VITALS — BP 122/58 | HR 69 | Temp 97.0°F | Ht 62.0 in | Wt 105.0 lb

## 2018-04-10 DIAGNOSIS — M25532 Pain in left wrist: Secondary | ICD-10-CM

## 2018-04-10 DIAGNOSIS — S62102A Fracture of unspecified carpal bone, left wrist, initial encounter for closed fracture: Secondary | ICD-10-CM | POA: Diagnosis not present

## 2018-04-10 DIAGNOSIS — S52502A Unspecified fracture of the lower end of left radius, initial encounter for closed fracture: Secondary | ICD-10-CM | POA: Diagnosis not present

## 2018-04-10 DIAGNOSIS — S52532A Colles' fracture of left radius, initial encounter for closed fracture: Secondary | ICD-10-CM | POA: Diagnosis not present

## 2018-04-10 NOTE — Progress Notes (Signed)
Subjective: CC: Left wrist pain PCP: Ernestina Penna, MD Courtney Frank is a 84 y.o. female presenting to clinic today for:  1. Left wrist pain Patient is accompanied by her caregiver, who notes that patient started complaining of left wrist pain on Wednesday.  She noticed soft tissue swelling.  Denies any known falls.  On Thursday, she noticed a small abrasion along the left temple.  She is unsure if this is related to the left wrist pain, as this was not identified the previous day.  Patient reports that ventral wrist is tender to palpation.  She has not been taking any medications because she is not having substantial pain when is not being palpated.  Denies any numbness or tingling in the hand.  Additionally, caregiver notes that she has been acting her normal self.  Oral intake is baseline.  She continues to walk daily at home and do normal activities.  No changes in behavior.   ROS: Per HPI  Allergies  Allergen Reactions  . Aricept [Donepezil Hcl]     Makes very sleepy  . Detrol [Tolterodine Tartrate]   . Duratuss [Phenylephrine-Guaifenesin]    Past Medical History:  Diagnosis Date  . Back pain   . Cataracts, bilateral   . Dementia   . GI bleed   . Incontinence   . Inguinal hernia   . Other and unspecified hyperlipidemia     Current Outpatient Medications:  .  acetaminophen (TYLENOL) 325 MG tablet, Take 650 mg by mouth every 6 (six) hours as needed.  , Disp: , Rfl:  .  BETIMOL 0.5 % ophthalmic solution, , Disp: , Rfl:  .  busPIRone (BUSPAR) 10 MG tablet, Take 1 tablet (10 mg total) by mouth at bedtime., Disp: 30 tablet, Rfl: 0 .  calcium carbonate (OS-CAL) 600 MG TABS, Take 600 mg by mouth 2 (two) times daily with a meal.  , Disp: , Rfl:  .  Cholecalciferol (VITAMIN D3) 1000 UNITS CAPS, Take 1 capsule by mouth daily.  , Disp: , Rfl:  .  ezetimibe (ZETIA) 10 MG tablet, TAKE 1 TABLET BY MOUTH ONCE DAILY, Disp: 90 tablet, Rfl: 0 .  Melatonin 3 MG TABS, Take 1 tablet  by mouth at bedtime., Disp: , Rfl:  .  omeprazole (PRILOSEC) 40 MG capsule, TAKE ONE CAPSULE BY MOUTH ONCE DAILY 1  HOUR  BEFORE  A  MEAL, Disp: 30 capsule, Rfl: 2 .  omeprazole (PRILOSEC) 40 MG capsule, TAKE 1 CAPSULE BY MOUTH ONCE DAILY ONE  HOUR  BEFORE  A  MEAL, Disp: 90 capsule, Rfl: 0 .  oxybutynin (DITROPAN-XL) 10 MG 24 hr tablet, TAKE ONE TABLET BY MOUTH ONCE DAILY, Disp: 30 tablet, Rfl: 2 .  oxybutynin (DITROPAN-XL) 10 MG 24 hr tablet, TAKE 1 TABLET BY MOUTH ONCE DAILY, Disp: 90 tablet, Rfl: 0 .  raloxifene (EVISTA) 60 MG tablet, TAKE ONE TABLET BY MOUTH ONCE DAILY, Disp: 30 tablet, Rfl: 2 .  raloxifene (EVISTA) 60 MG tablet, TAKE 1 TABLET BY MOUTH ONCE DAILY, Disp: 90 tablet, Rfl: 0 .  simvastatin (ZOCOR) 80 MG tablet, TAKE 1 TABLET BY MOUTH ONCE DAILY, Disp: 90 tablet, Rfl: 0 Social History   Socioeconomic History  . Marital status: Widowed    Spouse name: Not on file  . Number of children: Not on file  . Years of education: Not on file  . Highest education level: Not on file  Occupational History  . Not on file  Social Needs  . Financial  resource strain: Not on file  . Food insecurity:    Worry: Not on file    Inability: Not on file  . Transportation needs:    Medical: Not on file    Non-medical: Not on file  Tobacco Use  . Smoking status: Never Smoker  . Smokeless tobacco: Never Used  Substance and Sexual Activity  . Alcohol use: No  . Drug use: No  . Sexual activity: Not on file  Lifestyle  . Physical activity:    Days per week: Not on file    Minutes per session: Not on file  . Stress: Not on file  Relationships  . Social connections:    Talks on phone: Not on file    Gets together: Not on file    Attends religious service: Not on file    Active member of club or organization: Not on file    Attends meetings of clubs or organizations: Not on file    Relationship status: Not on file  . Intimate partner violence:    Fear of current or ex partner: Not on  file    Emotionally abused: Not on file    Physically abused: Not on file    Forced sexual activity: Not on file  Other Topics Concern  . Not on file  Social History Narrative  . Not on file   Family History  Problem Relation Age of Onset  . Heart attack Mother   . Healthy Father     Objective: Office vital signs reviewed. BP (!) 122/58   Pulse 69   Temp (!) 97 F (36.1 C) (Oral)   Ht  (1.575 m)   Wt 105 lb (47.6 kg)   BMI 19.20 kg/m   Physical Examination:  General: Awake, alert, thin, elderly female, No acute distress HEENT: there is a small abrasion appreciated along the left lateral orbit.  No bleeding or palpable defects.  No TTP to the zygomatic process.    Eyes: PRRLA on left.  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 Extraocular membranes intact, sclera white Pulm: normal work of breathing on room air GI: soft, non-tender, non-distended, bowel sounds present x4, no hepatomegaly, no splenomegaly, no masses Extremities: warm, well perfused, No cyanosis or clubbing; +2 pulses bilaterally MSK: slow gait and normal station;  Ambulates with minimal assistance.  Left wrist: Patient's active range of motion is limited by pain and swelling.  She has sensation intact to all of her fingers and is able to move all the fingers of the left hand independently.  She has a palpable soft tissue swelling that is approximately 1/2 cm x 1/2 cm in size on the ventral aspect of the wrist.  She is exquisitely tender to palpation over this area. Neuro: light touch sensation in tact  Dg Wrist Complete Left  Result Date: 04/10/2018 CLINICAL DATA:  Nontraumatic left wrist pain. EXAM: LEFT WRIST - COMPLETE 3+ VIEW COMPARISON:  None. FINDINGS: Nondisplaced distal radius fracture with transverse and oblique components. Despite negative trauma history the fracture appears acute, with soft tissue swelling. There is likely central articular surface extension without displacement. Probable mild dorsal  impaction with neutral wrist tilting. IMPRESSION: Nondisplaced distal radius fracture as described. Electronically Signed   By: Marnee Spring M.D.   On: 04/10/2018 09:55    Assessment/ Plan: 84 y.o. female   1. Closed fracture of left wrist, initial encounter X-rays were obtained and personal review of x-ray did demonstrate a radial fracture.  Per radiology report this is  a nondisplaced distal radius fracture with mild dorsal impaction.  I have placed patient in a splint and referred urgently to orthopedics for further evaluation and management.  We discussed pain control.  Because she is not having significant pain if it is not being palpated, no need to add additional medications.  May use scheduled Tylenol if needed for pain.  Should pain be uncontrolled by scheduled Tylenol I asked her to please contact the office for further instructions, as many of the pain medications would be extremely sedating and risks may outweigh benefits in this elderly, frail patient. - Ambulatory referral to Orthopedic Surgery  2. Acute pain of left wrist - DG Wrist Complete Left; Future - Ambulatory referral to Orthopedic Surgery   Orders Placed This Encounter  Procedures  . DG Wrist Complete Left    Standing Status:   Future    Number of Occurrences:   1    Standing Expiration Date:   06/11/2019    Order Specific Question:   Reason for Exam (SYMPTOM  OR DIAGNOSIS REQUIRED)    Answer:   left wrist swelling and pain.  no known trauma.    Order Specific Question:   Preferred imaging location?    Answer:   Internal    Order Specific Question:   Radiology Contrast Protocol - do NOT remove file path    Answer:   [email or web address]  . Ambulatory referral to Orthopedic Surgery    Referral Priority:   Urgent    Referral Type:   Surgical    Referral Reason:   Specialty Services Required    Requested Specialty:   Orthopedic Surgery    Number of Visits Requested:   1     Ashly Hulen Skains, DO [facility] [phone]

## 2018-05-01 DIAGNOSIS — S52532D Colles' fracture of left radius, subsequent encounter for closed fracture with routine healing: Secondary | ICD-10-CM | POA: Diagnosis not present

## 2018-05-26 DIAGNOSIS — S52532D Colles' fracture of left radius, subsequent encounter for closed fracture with routine healing: Secondary | ICD-10-CM | POA: Diagnosis not present

## 2018-06-10 ENCOUNTER — Ambulatory Visit: Payer: Medicare Other | Admitting: Family Medicine

## 2018-06-12 ENCOUNTER — Other Ambulatory Visit: Payer: Self-pay | Admitting: Family Medicine

## 2018-07-15 ENCOUNTER — Encounter: Payer: Self-pay | Admitting: Family Medicine

## 2018-07-15 ENCOUNTER — Ambulatory Visit (INDEPENDENT_AMBULATORY_CARE_PROVIDER_SITE_OTHER): Payer: Medicare Other | Admitting: Family Medicine

## 2018-07-15 ENCOUNTER — Telehealth: Payer: Self-pay | Admitting: Family Medicine

## 2018-07-15 VITALS — BP 124/70 | HR 65 | Temp 96.9°F | Ht 62.0 in | Wt 99.0 lb

## 2018-07-15 DIAGNOSIS — R413 Other amnesia: Secondary | ICD-10-CM

## 2018-07-15 DIAGNOSIS — H539 Unspecified visual disturbance: Secondary | ICD-10-CM

## 2018-07-15 DIAGNOSIS — E7800 Pure hypercholesterolemia, unspecified: Secondary | ICD-10-CM | POA: Diagnosis not present

## 2018-07-15 DIAGNOSIS — Z0000 Encounter for general adult medical examination without abnormal findings: Secondary | ICD-10-CM

## 2018-07-15 DIAGNOSIS — R634 Abnormal weight loss: Secondary | ICD-10-CM

## 2018-07-15 DIAGNOSIS — E559 Vitamin D deficiency, unspecified: Secondary | ICD-10-CM | POA: Diagnosis not present

## 2018-07-15 DIAGNOSIS — H0233 Blepharochalasis right eye, unspecified eyelid: Secondary | ICD-10-CM

## 2018-07-15 DIAGNOSIS — H02403 Unspecified ptosis of bilateral eyelids: Secondary | ICD-10-CM

## 2018-07-15 MED ORDER — MEGESTROL ACETATE 20 MG PO TABS: 20.0000 mg | ORAL_TABLET | Freq: Every day | ORAL | 3 refills | Status: DC

## 2018-07-15 NOTE — Telephone Encounter (Signed)
Noted - printed note for [facility] to discuss

## 2018-07-15 NOTE — Telephone Encounter (Signed)
PT niece who is POA has called wanting Dr Christell Constant to address these issues at her appointment today and then call her to let her know how it goes, she is unable to be at appointment  Wants him to address Loosing weight-possibly appetite enhancer? Running mucus out of eyes-feels like sand out of left eye and wants to know if something to ease discomfort Incontinence wants him to talk to her about wearing Depen everyday.

## 2018-07-15 NOTE — Progress Notes (Signed)
Subjective:    Patient ID: Courtney Frank, female    DOB: 03/08/1915, 84 y.o.   MRN: [id number]  HPI Pt here for follow up and management of chronic medical problems which includes hyperlipidemia. She is taking medication regularly.  The patient's niece who is her power of attorney wanted [place] to address several issues with the patient today.  One was losing weight and the possibility of giving her something to stimulate her appetite.  The other is that she has had mucus and drainage from her eyes.  The third problem is incontinence and wants the patient to be encouraged to wear her depends daily.  She was given a depression screen and scored a 15.  She has little interest in doing things almost every day.  In Courtney Frank is her niece and her phone number is [phone].  The patient according to the caregiver has not had any chest pain or shortness of breath anymore than usual.  She denies any trouble with her intestinal tract including nausea vomiting diarrhea or blood in the stool and is passing her water without problems.  She just does not want to eat and has become very picky.   Patient Active Problem List   Diagnosis Date Noted  . Thoracic aortic atherosclerosis ([place]) 09/24/2017  . Retinal hemorrhage noted on examination, right 09/07/2014  . Osteoporosis 03/09/2014  . Vitamin D deficiency 11/16/2013  . Nocturnal leg cramps 11/16/2013  . Insomnia 11/16/2013  . Urine frequency 08/04/2013  . Need for prophylactic vaccination against Streptococcus pneumoniae (pneumococcus) 08/04/2013  . History of GI bleed   . Hyperlipidemia   . Incontinence   . Back pain   . Cataracts, bilateral   . Inguinal hernia    Outpatient Encounter Medications as of 07/15/2018  Medication Sig  . acetaminophen (TYLENOL) 325 MG tablet Take 650 mg by mouth every 6 (six) hours as needed.    Marland Kitchen BETIMOL 0.5 % ophthalmic solution   . busPIRone (BUSPAR) 10 MG tablet Take 1 tablet (10 mg total) by mouth at bedtime.  .  calcium carbonate (OS-CAL) 600 MG TABS Take 600 mg by mouth 2 (two) times daily with a meal.    . Cholecalciferol (VITAMIN D3) 1000 UNITS CAPS Take 1 capsule by mouth daily.    Marland Kitchen ezetimibe (ZETIA) 10 MG tablet TAKE 1 TABLET BY MOUTH ONCE DAILY  . omeprazole (PRILOSEC) 40 MG capsule TAKE ONE CAPSULE BY MOUTH ONCE DAILY 1  HOUR  BEFORE  A  MEAL  . oxybutynin (DITROPAN-XL) 10 MG 24 hr tablet TAKE ONE TABLET BY MOUTH ONCE DAILY  . raloxifene (EVISTA) 60 MG tablet TAKE ONE TABLET BY MOUTH ONCE DAILY  . simvastatin (ZOCOR) 80 MG tablet TAKE 1 TABLET BY MOUTH ONCE DAILY  . [DISCONTINUED] ezetimibe (ZETIA) 10 MG tablet TAKE ONE TABLET BY MOUTH ONCE DAILY  . [DISCONTINUED] Melatonin 3 MG TABS Take 1 tablet by mouth at bedtime.  . [DISCONTINUED] omeprazole (PRILOSEC) 40 MG capsule TAKE 1 CAPSULE BY MOUTH ONCE DAILY ONE  HOUR  BEFORE  A  MEAL  . [DISCONTINUED] omeprazole (PRILOSEC) 40 MG capsule TAKE ONE CAPSULE BY MOUTH ONCE DAILY 1  HOUR  BEFORE  A  MEAL  . [DISCONTINUED] oxybutynin (DITROPAN-XL) 10 MG 24 hr tablet TAKE 1 TABLET BY MOUTH ONCE DAILY  . [DISCONTINUED] oxybutynin (DITROPAN-XL) 10 MG 24 hr tablet TAKE ONE TABLET BY MOUTH ONCE DAILY  . [DISCONTINUED] raloxifene (EVISTA) 60 MG tablet TAKE 1 TABLET BY MOUTH ONCE DAILY  . [  DISCONTINUED] raloxifene (EVISTA) 60 MG tablet TAKE 1 TABLET BY MOUTH ONCE DAILY  . [DISCONTINUED] simvastatin (ZOCOR) 80 MG tablet TAKE ONE TABLET BY MOUTH ONCE DAILY   No facility-administered encounter medications on file as of 07/15/2018.       Review of Systems  Constitutional: Positive for appetite change (decreased ) and unexpected weight change (decreased ).  HENT: Negative.   Eyes: Positive for discharge (feels "sandy" left eye).  Respiratory: Negative.   Cardiovascular: Negative.   Gastrointestinal: Negative.   Endocrine: Negative.   Genitourinary: Negative.        Urine incontinence   Musculoskeletal: Negative.   Skin: Negative.   Allergic/Immunologic:  Negative.   Neurological: Negative.   Hematological: Negative.   Psychiatric/Behavioral: Negative.        Objective:   Physical Exam  Constitutional: No distress.  The patient's demeanor has definitely changed since she was last seen by me.  She is usually much more alert and responsive to little jokes that I made with her.  Today she is not.  She has her eyes closed most of the visit.  She is elderly and small framed and losing some weight.  HENT:  Head: Normocephalic and atraumatic.  Right Ear: External ear normal.  Left Ear: External ear normal.  Nose: Nose normal.  Mouth/Throat: Oropharynx is clear and moist. No oropharyngeal exudate.  Eyes: Pupils are equal, round, and reactive to light. EOM are normal. Right eye exhibits no discharge. Left eye exhibits no discharge. No scleral icterus.  Redundant skin of both eyes and conjunctival redness from what appears to be dry eyes.  The patient is currently using Systane.  She is not doing this regularly.  Neck: Normal range of motion. Neck supple. No thyromegaly present.  Cardiovascular: Normal rate, regular rhythm and normal heart sounds.  No murmur heard. The heart is regular at 72/min  Pulmonary/Chest: Effort normal and breath sounds normal. No respiratory distress. She has no wheezes. She has no rales.  Clear anteriorly and posteriorly  Abdominal: Soft. Bowel sounds are normal. She exhibits no mass. There is no tenderness.  The abdomen is soft without masses or tenderness  Musculoskeletal: Normal range of motion. She exhibits no edema.  Lymphadenopathy:    She has no cervical adenopathy.  Neurological: She is alert. She has normal reflexes. No cranial nerve deficit.  The patient today knew my name but knows not respond as she usually does to comments to check her intactness with the outside world.  She normally has a quick sense of humor about her.  Today that is missing.  Skin: Skin is warm and dry. No rash noted.  Psychiatric:    The patient's mood affect and behavior are flatter today than usual she is 84 years old.  We will not start any antidepressants.  Nursing note and vitals reviewed.  BP 124/70 (BP Location: Left Arm)   Pulse 65   Temp (!) 96.9 F (36.1 C) (Oral)   Ht 5' 2"  (1.575 m)   Wt 99 lb (44.9 kg)   BMI 18.11 kg/m         Assessment & Plan:  1. Pure hypercholesterolemia -Pending results of lab work - BMP8+EGFR - CBC with Differential/Platelet - Lipid panel - Hepatic function panel  2. Vitamin D deficiency -Continue with current treatment pending results of lab work - CBC with Differential/Platelet - VITAMIN D 25 Hydroxy (Vit-D Deficiency, Fractures)  3. Memory impairment of gradual onset -The progression of this impairment seems to be  more rapid recently. - CBC with Differential/Platelet  4. Health care maintenance - BMP8+EGFR - CBC with Differential/Platelet  5. Visual disturbance - Ambulatory referral to Ophthalmology  6. Redundant eyelid of both eyes - Ambulatory referral to Ophthalmology  7. Memory changes -The memory issues seem to have progressed and it may be that things are beginning to shut down for her because of her age.  8. Weight loss, unintentional -Trial of Megace 20 mg 1 daily  Meds ordered this encounter  Medications  . megestrol (MEGACE) 20 MG tablet    Sig: Take 1 tablet (20 mg total) by mouth daily.    Dispense:  30 tablet    Refill:  3   Patient Instructions                       Medicare Annual Wellness Visit  [facility] and the medical providers at [facility] strive to bring you the best medical care.  In doing so we not only want to address your current medical conditions and concerns but also to detect new conditions early and prevent illness, disease and health-related problems.    Medicare offers a yearly Wellness Visit which allows our clinical staff to assess your need for preventative services including  immunizations, lifestyle education, counseling to decrease risk of preventable diseases and screening for fall risk and other medical concerns.    This visit is provided free of charge (no copay) for all Medicare recipients. The clinical pharmacists at [facility] have begun to conduct these Wellness Visits which will also include a thorough review of all your medications.    As you primary medical provider recommend that you make an appointment for your Annual Wellness Visit if you have not done so already this year.  You may set up this appointment before you leave today or you may call back ([phone]) and schedule an appointment.  Please make sure when you call that you mention that you are scheduling your Annual Wellness Visit with the clinical pharmacist so that the appointment may be made for the proper length of time.     Continue current medications. Continue good therapeutic lifestyle changes which include good diet and exercise. Fall precautions discussed with patient. If an FOBT was given today- please return it to our front desk. If you are over 60 years old - you may need Prevnar 13 or the adult Pneumonia vaccine.  **Flu shots are available--- please call and schedule a FLU-CLINIC appointment**  After your visit with [place] today you will receive a survey in the mail or online from [employer] regarding your care with [place]. Please take a moment to fill this out. Your feedback is very important to [place] as you can help [place] better understand your patient needs as well as improve your experience and satisfaction. WE CARE ABOUT YOU!!!   We would asked the caregiver to try to encourage her to continue to drink fluids and eat healthy foods. She should continue to drink plenty of water We will try to arrange an appointment with [facility] eye care in [place] to see if there is any thing that can be done short of any major surgery to improve her vision. We will  try Megace to stimulate her appetite and would ask her caregiver to give [place] a call back in about 4 weeks to let us know if this is helping any or not.  Arrie Senate MD

## 2018-07-15 NOTE — Patient Instructions (Addendum)
Medicare Annual Wellness Visit  [facility] and the medical providers at [facility] strive to bring you the best medical care.  In doing so we not only want to address your current medical conditions and concerns but also to detect new conditions early and prevent illness, disease and health-related problems.    Medicare offers a yearly Wellness Visit which allows our clinical staff to assess your need for preventative services including immunizations, lifestyle education, counseling to decrease risk of preventable diseases and screening for fall risk and other medical concerns.    This visit is provided free of charge (no copay) for all Medicare recipients. The clinical pharmacists at [facility] have begun to conduct these Wellness Visits which will also include a thorough review of all your medications.    As you primary medical provider recommend that you make an appointment for your Annual Wellness Visit if you have not done so already this year.  You may set up this appointment before you leave today or you may call back ([phone]) and schedule an appointment.  Please make sure when you call that you mention that you are scheduling your Annual Wellness Visit with the clinical pharmacist so that the appointment may be made for the proper length of time.     Continue current medications. Continue good therapeutic lifestyle changes which include good diet and exercise. Fall precautions discussed with patient. If an FOBT was given today- please return it to our front desk. If you are over 40 years old - you may need Prevnar 13 or the adult Pneumonia vaccine.  **Flu shots are available--- please call and schedule a FLU-CLINIC appointment**  After your visit with [place] today you will receive a survey in the mail or online from [employer] regarding your care with [place]. Please take a moment to fill this out. Your feedback is very  important to [place] as you can help [place] better understand your patient needs as well as improve your experience and satisfaction. WE CARE ABOUT YOU!!!   We would asked the caregiver to try to encourage her to continue to drink fluids and eat healthy foods. She should continue to drink plenty of water We will try to arrange an appointment with [facility] eye care in [place] to see if there is any thing that can be done short of any major surgery to improve her vision. We will try Megace to stimulate her appetite and would ask her caregiver to give [place] a call back in about 4 weeks to let us know if this is helping any or not.

## 2018-07-16 ENCOUNTER — Encounter: Payer: Self-pay | Admitting: *Deleted

## 2018-07-16 LAB — BMP8+EGFR
BUN / CREAT RATIO: 16 (ref 12–28)
BUN: 15 mg/dL (ref 10–36)
CHLORIDE: 104 mmol/L (ref 96–106)
CO2: 26 mmol/L (ref 20–29)
Calcium: 9.7 mg/dL (ref 8.7–10.3)
Creatinine, Ser: 0.92 mg/dL (ref 0.57–1.00)
GFR calc non Af Amer: 51 mL/min/{1.73_m2} — ABNORMAL LOW (ref >59)
GFR, EST AFRICAN AMERICAN: 59 mL/min/{1.73_m2} — AB (ref >59)
GLUCOSE: 117 mg/dL — AB (ref 65–99)
Potassium: 4.2 mmol/L (ref 3.5–5.2)
SODIUM: 147 mmol/L — AB (ref 134–144)

## 2018-07-16 LAB — VITAMIN D 25 HYDROXY (VIT D DEFICIENCY, FRACTURES): Vit D, 25-Hydroxy: 39.8 ng/mL (ref 30.0–100.0)

## 2018-07-16 LAB — HEPATIC FUNCTION PANEL
ALBUMIN: 3.8 g/dL (ref 3.2–4.6)
ALT: 10 IU/L (ref 0–32)
AST: 13 IU/L (ref 0–40)
Alkaline Phosphatase: 76 IU/L (ref 39–117)
Bilirubin Total: 0.4 mg/dL (ref 0.0–1.2)
Bilirubin, Direct: 0.12 mg/dL (ref 0.00–0.40)
Total Protein: 6.2 g/dL (ref 6.0–8.5)

## 2018-07-16 LAB — LIPID PANEL
CHOL/HDL RATIO: 3 ratio (ref 0.0–4.4)
Cholesterol, Total: 175 mg/dL (ref 100–199)
HDL: 58 mg/dL (ref >39)
LDL CALC: 95 mg/dL (ref 0–99)
Triglycerides: 108 mg/dL (ref 0–149)
VLDL Cholesterol Cal: 22 mg/dL (ref 5–40)

## 2018-07-16 LAB — CBC WITH DIFFERENTIAL/PLATELET
BASOS ABS: 0 10*3/uL (ref 0.0–0.2)
Basos: 1 %
EOS (ABSOLUTE): 0 10*3/uL (ref 0.0–0.4)
EOS: 1 %
HEMATOCRIT: 43 % (ref 34.0–46.6)
HEMOGLOBIN: 14 g/dL (ref 11.1–15.9)
Immature Grans (Abs): 0 10*3/uL (ref 0.0–0.1)
Immature Granulocytes: 0 %
LYMPHS ABS: 0.8 10*3/uL (ref 0.7–3.1)
Lymphs: 30 %
MCH: 32 pg (ref 26.6–33.0)
MCHC: 32.6 g/dL (ref 31.5–35.7)
MCV: 98 fL — ABNORMAL HIGH (ref 79–97)
MONOCYTES: 12 %
MONOS ABS: 0.3 10*3/uL (ref 0.1–0.9)
Neutrophils Absolute: 1.5 10*3/uL (ref 1.4–7.0)
Neutrophils: 56 %
Platelets: 186 10*3/uL (ref 150–450)
RBC: 4.38 x10E6/uL (ref 3.77–5.28)
RDW: 13.5 % (ref 12.3–15.4)
WBC: 2.7 10*3/uL — AB (ref 3.4–10.8)

## 2018-07-17 ENCOUNTER — Telehealth: Payer: Self-pay

## 2018-07-17 NOTE — Telephone Encounter (Signed)
Do you know of what else we could try? She is 84 yr old and 98 lbs. No appetite. Insurance denied the 20 mg tabs

## 2018-07-17 NOTE — Telephone Encounter (Signed)
Please see if there are any other options for appetite stimulation from the pharmacist other than multivitamins.  And other than Ensure. Please let patient's power of attorney know this.

## 2018-07-17 NOTE — Telephone Encounter (Signed)
Insurance denied prior auth for Megestrol

## 2018-07-20 ENCOUNTER — Telehealth: Payer: Self-pay | Admitting: Family Medicine

## 2018-07-20 ENCOUNTER — Telehealth: Payer: Self-pay | Admitting: *Deleted

## 2018-07-20 NOTE — Telephone Encounter (Signed)
LM for ANN (POA) to call me to discuss med options from clinical pharm Marcelino Duster. 07/20/18-jhb

## 2018-07-20 NOTE — Telephone Encounter (Signed)
Reita Cliche [employer]) called and aware that he is not on [place] (ROI) form and that we can not discuss her health with him.   I LM for ANN (POA) to call me -jhb 07-20-18.

## 2018-07-21 ENCOUNTER — Telehealth: Payer: Self-pay | Admitting: Family Medicine

## 2018-07-21 NOTE — Telephone Encounter (Signed)
The patient's son Courtney Frank was called in [place] and I explained to him about the declining mental condition of his mother at least what I observed on the day of her visit.  I discussed that we decided to discontinue the statin drug because of her age and the good cholesterol numbers.  Also discussed that we recommended she see [facility] ophthalmology because of the redundant skin around her eyes and her diminished vision because of the skin.  I reviewed the lab work that have been returned with him.  I also mentioned that we had started her on some Remeron to see if this would not help her calm down and rest better and be more cooperative with eating and drinking during the day.  He asked me to feel free to call him at any time with any changes that I found regarding her.  I put his phone number and the sticky note.

## 2018-07-22 MED ORDER — MIRTAZAPINE 15 MG PO TABS: 7.5000 mg | ORAL_TABLET | Freq: Every day | ORAL | 0 refills | Status: DC

## 2018-07-22 NOTE — Telephone Encounter (Signed)
LM for ANN to call me back  to discuss meds -jhb

## 2018-07-22 NOTE — Telephone Encounter (Signed)
Spoke with Courtney Frank and she is aware of the changes made : d/c simvastatin and start remeron 7.5 qd. - med sent to [facility]

## 2018-07-23 LAB — SPECIMEN STATUS REPORT

## 2018-07-27 ENCOUNTER — Telehealth: Payer: Self-pay | Admitting: Family Medicine

## 2018-07-27 NOTE — Telephone Encounter (Signed)
remeron added to allergy list   FYI to Dr Christell ConstantMoore.      (Simvastatin and Evista have been D/C)

## 2018-08-03 DIAGNOSIS — H02102 Unspecified ectropion of right lower eyelid: Secondary | ICD-10-CM | POA: Diagnosis not present

## 2018-08-18 ENCOUNTER — Ambulatory Visit (INDEPENDENT_AMBULATORY_CARE_PROVIDER_SITE_OTHER): Payer: Medicare Other | Admitting: *Deleted

## 2018-08-18 DIAGNOSIS — Z23 Encounter for immunization: Secondary | ICD-10-CM

## 2018-09-05 ENCOUNTER — Other Ambulatory Visit: Payer: Self-pay | Admitting: Family Medicine

## 2018-09-22 DIAGNOSIS — M79676 Pain in unspecified toe(s): Secondary | ICD-10-CM | POA: Diagnosis not present

## 2018-09-22 DIAGNOSIS — B351 Tinea unguium: Secondary | ICD-10-CM | POA: Diagnosis not present

## 2018-09-22 DIAGNOSIS — I70203 Unspecified atherosclerosis of native arteries of extremities, bilateral legs: Secondary | ICD-10-CM | POA: Diagnosis not present

## 2018-09-22 DIAGNOSIS — L84 Corns and callosities: Secondary | ICD-10-CM | POA: Diagnosis not present

## 2018-10-30 ENCOUNTER — Other Ambulatory Visit: Payer: Self-pay | Admitting: Family Medicine

## 2018-11-24 ENCOUNTER — Telehealth: Payer: Self-pay | Admitting: Family Medicine

## 2018-11-24 NOTE — Telephone Encounter (Signed)
Called and changed appt.

## 2018-11-24 NOTE — Telephone Encounter (Signed)
Courtney Frank is unable to come at [facility].  Her appointments are usually scheduled for the am.  Would like for Asher Muir to call her and get her scheduled earlier please.

## 2018-11-25 ENCOUNTER — Ambulatory Visit: Payer: Medicare Other | Admitting: Family Medicine

## 2018-12-14 ENCOUNTER — Telehealth: Payer: Self-pay | Admitting: Family Medicine

## 2018-12-14 NOTE — Telephone Encounter (Signed)
Patient has apt 2/19- wants something sooner. Can she be worked in? No 30 min apts

## 2018-12-14 NOTE — Telephone Encounter (Signed)
I called and worked pt in for 2/6 at 145.

## 2018-12-14 NOTE — Telephone Encounter (Signed)
Attempted to contact patient - NA °

## 2018-12-17 ENCOUNTER — Ambulatory Visit (INDEPENDENT_AMBULATORY_CARE_PROVIDER_SITE_OTHER): Payer: Medicare Other | Admitting: Family Medicine

## 2018-12-17 ENCOUNTER — Encounter: Payer: Self-pay | Admitting: Family Medicine

## 2018-12-17 VITALS — BP 113/62 | HR 78 | Temp 97.6°F | Ht 62.0 in | Wt 104.0 lb

## 2018-12-17 DIAGNOSIS — E559 Vitamin D deficiency, unspecified: Secondary | ICD-10-CM

## 2018-12-17 DIAGNOSIS — E7800 Pure hypercholesterolemia, unspecified: Secondary | ICD-10-CM | POA: Diagnosis not present

## 2018-12-17 DIAGNOSIS — W19XXXA Unspecified fall, initial encounter: Secondary | ICD-10-CM | POA: Diagnosis not present

## 2018-12-17 DIAGNOSIS — H539 Unspecified visual disturbance: Secondary | ICD-10-CM | POA: Diagnosis not present

## 2018-12-17 NOTE — Addendum Note (Signed)
Addended by: Magdalene River on: 12/17/2018 02:25 PM   Modules accepted: Orders

## 2018-12-17 NOTE — Patient Instructions (Addendum)
Medicare Annual Wellness Visit  [facility] and the medical providers at [facility] strive to bring you the best medical care.  In doing so we not only want to address your current medical conditions and concerns but also to detect new conditions early and prevent illness, disease and health-related problems.    Medicare offers a yearly Wellness Visit which allows our clinical staff to assess your need for preventative services including immunizations, lifestyle education, counseling to decrease risk of preventable diseases and screening for fall risk and other medical concerns.    This visit is provided free of charge (no copay) for all Medicare recipients. The clinical pharmacists at [facility] have begun to conduct these Wellness Visits which will also include a thorough review of all your medications.    As you primary medical provider recommend that you make an appointment for your Annual Wellness Visit if you have not done so already this year.  You may set up this appointment before you leave today or you may call back ([phone]) and schedule an appointment.  Please make sure when you call that you mention that you are scheduling your Annual Wellness Visit with the clinical pharmacist so that the appointment may be made for the proper length of time.    Continue current medications. Continue good therapeutic lifestyle changes which include good diet and exercise. Fall precautions discussed with patient. If an FOBT was given today- please return it to our front desk. If you are over 42 years old - you may need Prevnar 13 or the adult Pneumonia vaccine.  **Flu shots are available--- please call and schedule a FLU-CLINIC appointment**  After your visit with [place] today you will receive a survey in the mail or online from [employer] regarding your care with [place]. Please take a moment to fill this out. Your feedback is very  important to [place] as you can help [place] better understand your patient needs as well as improve your experience and satisfaction. WE CARE ABOUT YOU!!!   Discontinue the raloxifene once the current supply is completed Encouraged the patient to drink plenty of water and fluids Encourage the patient to use her cane or walker so as not to fall If she does complain with headaches and they get worse, we would need to get a CT scan.  Otherwise, just give her Tylenol.

## 2018-12-17 NOTE — Progress Notes (Signed)
Subjective:    Patient ID: Courtney Frank, female    DOB: 05-18-1915, 85 y.o.   MRN: [id number]  HPI Pt here for follow up and management of chronic medical problems which includes hyperlipidemia. She is taking medication regularly.  Patient had a recent fall 3 days ago but did not hit her head just fell to her knees.  She has had headaches for a long period of time.  The caregiver says that as far she knows not there is not been any head injury.  Her vital signs are stable.  She is pleasant and alert and when I asked her if I needed to get her man, she said only if he is wealthy.  She has seen the eye doctor about her eyes with the ectropion that she has and they could not do anything to help her.  The fall that she has was witnessed and it was getting out of a chair and she went to her knees but did not hit her head.  The headaches according to the sitter are no worse than usual.  She denies any chest pain pressure tightness or shortness of breath.  She denies any trouble with swallowing her food nausea vomiting diarrhea blood in the stool or black tarry bowel movements.  She did have a GI bleed at one time in the distant past but the sitter has not noticed any changes in her bowel habits.  She is passing her water well and according to the sitter may need to drink more fluids to stay better hydrated.  Viewing her medicines we will discontinue her raloxifene.  She will finish the current supply and then discontinue it    Patient Active Problem List   Diagnosis Date Noted  . Thoracic aortic atherosclerosis ([place]) 09/24/2017  . Retinal hemorrhage noted on examination, right 09/07/2014  . Osteoporosis 03/09/2014  . Vitamin D deficiency 11/16/2013  . Nocturnal leg cramps 11/16/2013  . Insomnia 11/16/2013  . Urine frequency 08/04/2013  . Need for prophylactic vaccination against Streptococcus pneumoniae (pneumococcus) 08/04/2013  . History of GI bleed   . Hyperlipidemia   . Incontinence   .  Back pain   . Cataracts, bilateral   . Inguinal hernia    Outpatient Encounter Medications as of 12/17/2018  Medication Sig  . acetaminophen (TYLENOL) 325 MG tablet Take 650 mg by mouth every 6 (six) hours as needed.    . calcium carbonate (OS-CAL) 600 MG TABS Take 600 mg by mouth 2 (two) times daily with a meal.    . Cholecalciferol (VITAMIN D3) 1000 UNITS CAPS Take 1 capsule by mouth daily.    Marland Kitchen ezetimibe (ZETIA) 10 MG tablet TAKE 1 TABLET BY MOUTH ONCE DAILY  . omeprazole (PRILOSEC) 40 MG capsule TAKE ONE CAPSULE BY MOUTH ONCE DAILY 1  HOUR  BEFORE  A  MEAL  . oxybutynin (DITROPAN-XL) 10 MG 24 hr tablet TAKE ONE TABLET BY MOUTH ONCE DAILY  . raloxifene (EVISTA) 60 MG tablet TAKE 1 TABLET BY MOUTH ONCE DAILY  . simvastatin (ZOCOR) 80 MG tablet TAKE ONE TABLET BY MOUTH ONCE DAILY  . [DISCONTINUED] BETIMOL 0.5 % ophthalmic solution   . [DISCONTINUED] busPIRone (BUSPAR) 10 MG tablet Take 1 tablet (10 mg total) by mouth at bedtime.  . [DISCONTINUED] megestrol (MEGACE) 20 MG tablet Take 1 tablet (20 mg total) by mouth daily.   No facility-administered encounter medications on file as of 12/17/2018.      Review of Systems  Constitutional: Negative.  HENT: Negative.   Eyes: Negative.   Respiratory: Negative.   Cardiovascular: Negative.   Gastrointestinal: Negative.   Endocrine: Negative.   Genitourinary: Negative.   Musculoskeletal: Negative.   Skin: Negative.   Allergic/Immunologic: Negative.   Neurological: Positive for headaches.  Hematological: Negative.   Psychiatric/Behavioral: Negative.        Objective:   Physical Exam Vitals signs and nursing note reviewed.  Constitutional:      General: She is not in acute distress.    Appearance: She is well-developed. She is not ill-appearing.     Comments: Elderly but responding to questions appropriately.  HENT:     Head: Normocephalic and atraumatic.     Right Ear: Tympanic membrane, ear canal and external ear normal. There is  no impacted cerumen.     Left Ear: Tympanic membrane, ear canal and external ear normal. There is no impacted cerumen.     Nose: Nose normal. No congestion.     Mouth/Throat:     Mouth: Mucous membranes are moist.     Pharynx: Oropharynx is clear. No oropharyngeal exudate or posterior oropharyngeal erythema.  Eyes:     General: No scleral icterus.       Right eye: No discharge.        Left eye: No discharge.     Extraocular Movements: Extraocular movements intact.     Conjunctiva/sclera: Conjunctivae normal.     Pupils: Pupils are equal, round, and reactive to light.     Comments: Ectropion both eyes both lower and upper lids no sign of any infection.  Neck:     Musculoskeletal: Normal range of motion and neck supple.     Thyroid: No thyromegaly.     Vascular: No JVD.  Cardiovascular:     Rate and Rhythm: Normal rate and regular rhythm.     Heart sounds: Normal heart sounds. No murmur.     Comments: Is regular at 72/min with no edema in lower extremities Pulmonary:     Effort: Pulmonary effort is normal. No respiratory distress.     Breath sounds: Normal breath sounds. No wheezing or rales.  Abdominal:     General: Abdomen is flat. Bowel sounds are normal.     Palpations: Abdomen is soft. There is no mass.     Tenderness: There is no abdominal tenderness. There is no guarding or rebound.  Musculoskeletal: Normal range of motion.        General: No tenderness.     Right lower leg: No edema.     Left lower leg: No edema.     Comments: Some gait instability but uses cane for stability  Lymphadenopathy:     Cervical: No cervical adenopathy.  Skin:    General: Skin is warm and dry.     Findings: No rash.  Neurological:     General: No focal deficit present.     Mental Status: She is alert and oriented to person, place, and time. Mental status is at baseline.     Cranial Nerves: No cranial nerve deficit.     Motor: Weakness present.  Psychiatric:        Mood and Affect: Mood  normal.        Behavior: Behavior normal.        Thought Content: Thought content normal.        Judgment: Judgment normal.     Comments: The patient's mood affect and behavior were stable for her    BP 113/62 (BP Location: Left  Arm)   Pulse 78   Temp 97.6 F (36.4 C) (Oral)   Ht 5' 2"  (1.575 m)   Wt 104 lb (47.2 kg)   BMI 19.02 kg/m         Assessment & Plan:  1. Pure hypercholesterolemia -Continue with simvastatin and Zetia and eating a healthy diet - CBC with Differential/Platelet - Lipid panel - BMP8+EGFR - Hepatic function panel  2. Vitamin D deficiency -Continue with vitamin D replacement - CBC with Differential/Platelet  3. Fall, initial encounter -Caregivers should watch patient closely and assist her with standing if necessary she should use her cane or walker at home as needed to maintain the stability. - CBC with Differential/Platelet - BMP8+EGFR  4. Visual disturbance -Patient did go see the eye doctors and they said there was nothing else they could do regarding her eyelid issues.  No orders of the defined types were placed in this encounter.  Patient Instructions                       Medicare Annual Wellness Visit  [place] and the medical providers at [facility] strive to bring you the best medical care.  In doing so we not only want to address your current medical conditions and concerns but also to detect new conditions early and prevent illness, disease and health-related problems.    Medicare offers a yearly Wellness Visit which allows our clinical staff to assess your need for preventative services including immunizations, lifestyle education, counseling to decrease risk of preventable diseases and screening for fall risk and other medical concerns.    This visit is provided free of charge (no copay) for all Medicare recipients. The clinical pharmacists at [facility] have begun to conduct these  Wellness Visits which will also include a thorough review of all your medications.    As you primary medical provider recommend that you make an appointment for your Annual Wellness Visit if you have not done so already this year.  You may set up this appointment before you leave today or you may call back ([phone]) and schedule an appointment.  Please make sure when you call that you mention that you are scheduling your Annual Wellness Visit with the clinical pharmacist so that the appointment may be made for the proper length of time.    Continue current medications. Continue good therapeutic lifestyle changes which include good diet and exercise. Fall precautions discussed with patient. If an FOBT was given today- please return it to our front desk. If you are over 81 years old - you may need Prevnar 98 or the adult Pneumonia vaccine.  **Flu shots are available--- please call and schedule a FLU-CLINIC appointment**  After your visit with [place] today you will receive a survey in the mail or online from [employer] regarding your care with [place]. Please take a moment to fill this out. Your feedback is very important to [place] as you can help [place] better understand your patient needs as well as improve your experience and satisfaction. WE CARE ABOUT YOU!!!   Discontinue the raloxifene once the current supply is completed Encouraged the patient to drink plenty of water and fluids Encourage the patient to use her cane or walker so as not to fall If she does complain with headaches and they get worse, we would need to get a CT scan.  Otherwise, just give her Tylenol.  Arrie Senate MD

## 2018-12-18 ENCOUNTER — Telehealth: Payer: Self-pay | Admitting: *Deleted

## 2018-12-18 LAB — HEPATIC FUNCTION PANEL
ALT: 9 IU/L (ref 0–32)
AST: 13 IU/L (ref 0–40)
Albumin: 3.6 g/dL (ref 3.5–4.6)
Alkaline Phosphatase: 84 IU/L (ref 39–117)
Bilirubin Total: <0.2 mg/dL (ref 0.0–1.2)
Bilirubin, Direct: 0.07 mg/dL (ref 0.00–0.40)
Total Protein: 6 g/dL (ref 6.0–8.5)

## 2018-12-18 LAB — CBC WITH DIFFERENTIAL/PLATELET
BASOS ABS: 0 10*3/uL (ref 0.0–0.2)
Basos: 0 %
EOS (ABSOLUTE): 0 10*3/uL (ref 0.0–0.4)
EOS: 1 %
HEMATOCRIT: 39.5 % (ref 34.0–46.6)
HEMOGLOBIN: 13.5 g/dL (ref 11.1–15.9)
Immature Grans (Abs): 0 10*3/uL (ref 0.0–0.1)
Immature Granulocytes: 0 %
LYMPHS ABS: 0.9 10*3/uL (ref 0.7–3.1)
Lymphs: 17 %
MCH: 32.1 pg (ref 26.6–33.0)
MCHC: 34.2 g/dL (ref 31.5–35.7)
MCV: 94 fL (ref 79–97)
MONOCYTES: 8 %
Monocytes Absolute: 0.4 10*3/uL (ref 0.1–0.9)
NEUTROS PCT: 74 %
Neutrophils Absolute: 3.9 10*3/uL (ref 1.4–7.0)
Platelets: 189 10*3/uL (ref 150–450)
RBC: 4.21 x10E6/uL (ref 3.77–5.28)
RDW: 13.1 % (ref 11.7–15.4)
WBC: 5.2 10*3/uL (ref 3.4–10.8)

## 2018-12-18 LAB — BMP8+EGFR
BUN / CREAT RATIO: 25 (ref 12–28)
BUN: 17 mg/dL (ref 10–36)
CALCIUM: 9.3 mg/dL (ref 8.7–10.3)
CO2: 27 mmol/L (ref 20–29)
Chloride: 104 mmol/L (ref 96–106)
Creatinine, Ser: 0.67 mg/dL (ref 0.57–1.00)
GFR, EST AFRICAN AMERICAN: 83 mL/min/{1.73_m2} (ref >59)
GFR, EST NON AFRICAN AMERICAN: 72 mL/min/{1.73_m2} (ref >59)
Glucose: 100 mg/dL — ABNORMAL HIGH (ref 65–99)
POTASSIUM: 4.3 mmol/L (ref 3.5–5.2)
Sodium: 146 mmol/L — ABNORMAL HIGH (ref 134–144)

## 2018-12-18 LAB — LIPID PANEL
CHOLESTEROL TOTAL: 160 mg/dL (ref 100–199)
Chol/HDL Ratio: 2.6 ratio (ref 0.0–4.4)
HDL: 62 mg/dL (ref >39)
LDL CALC: 67 mg/dL (ref 0–99)
TRIGLYCERIDES: 157 mg/dL — AB (ref 0–149)
VLDL Cholesterol Cal: 31 mg/dL (ref 5–40)

## 2018-12-18 NOTE — Telephone Encounter (Signed)
Pt notified of results Verbalizes understanding 

## 2018-12-18 NOTE — Telephone Encounter (Signed)
-----   Message from Ernestina Penna, MD sent at 12/18/2018  9:32 AM EST ----- Please call the patient's caregiver who stays with her on a daily basis with a these results The CBC has a normal white blood cell count.  The hemoglobin remains good and stable at 13.5 and the platelet count is adequate Cholesterol numbers with traditional lipid testing have an LDL-C or bad cholesterol that remains excellent at 67.  Good cholesterol is good at 62.  Triglycerides are slightly elevated at 157 and the patient was not fasting when this was done.  She should continue with her current statin therapy which is simvastatin and healthy eating lifestyles.. The blood sugar is slightly elevated at 100.  The creatinine, the most important kidney function test is within normal limits.  All of the electrolytes including potassium are good except the serum slight sodium is slightly elevated and this is been the case in the past. All liver function test are within normal limits

## 2018-12-30 ENCOUNTER — Ambulatory Visit: Payer: Medicare Other | Admitting: Family Medicine

## 2019-01-05 ENCOUNTER — Encounter: Payer: Self-pay | Admitting: *Deleted

## 2019-01-05 ENCOUNTER — Ambulatory Visit (INDEPENDENT_AMBULATORY_CARE_PROVIDER_SITE_OTHER): Payer: Medicare Other | Admitting: *Deleted

## 2019-01-05 VITALS — BP 108/64 | HR 79 | Ht 62.0 in | Wt 104.0 lb

## 2019-01-05 DIAGNOSIS — Z0000 Encounter for general adult medical examination without abnormal findings: Secondary | ICD-10-CM | POA: Diagnosis not present

## 2019-01-05 NOTE — Patient Instructions (Signed)
Please work on your goal of fall prevention by using cane or walker at all times and continuing to move carefully.   At your convenience, please bring a copy of your Advance Directives (Healthcare Power of Attorney and Living Will) to our office to be filed in your medical record.  Please follow up with Dr. Laurance Flatten as scheduled.  Thank you for coming in for your Annual Wellness Visit today!   Preventive Care 83 Years and Older, Female Preventive care refers to lifestyle choices and visits with your health care provider that can promote health and wellness. What does preventive care include?  A yearly physical exam. This is also called an annual well check.  Dental exams once or twice a year.  Routine eye exams. Ask your health care provider how often you should have your eyes checked.  Personal lifestyle choices, including: ? Daily care of your teeth and gums. ? Regular physical activity. ? Eating a healthy diet. ? Avoiding tobacco and drug use. ? Limiting alcohol use. ? Practicing safe sex. ? Taking low-dose aspirin every day. ? Taking vitamin and mineral supplements as recommended by your health care provider. What happens during an annual well check? The services and screenings done by your health care provider during your annual well check will depend on your age, overall health, lifestyle risk factors, and family history of disease. Counseling Your health care provider may ask you questions about your:  Alcohol use.  Tobacco use.  Drug use.  Emotional well-being.  Home and relationship well-being.  Sexual activity.  Eating habits.  History of falls.  Memory and ability to understand (cognition).  Work and work Statistician.  Reproductive health.  Screening You may have the following tests or measurements:  Height, weight, and BMI.  Blood pressure.  Lipid and cholesterol levels. These may be checked every 5 years, or more frequently if you are over 83  years old.  Skin check.  Lung cancer screening. You may have this screening every year starting at age 83 if you have a 30-pack-year history of smoking and currently smoke or have quit within the past 15 years.  Colorectal cancer screening. All adults should have this screening starting at age 83 and continuing until age 65. You will have tests every 1-10 years, depending on your results and the type of screening test. People at increased risk should start screening at an earlier age. Screening tests may include: ? Guaiac-based fecal occult blood testing. ? Fecal immunochemical test (FIT). ? Stool DNA test. ? Virtual colonoscopy. ? Sigmoidoscopy. During this test, a flexible tube with a tiny camera (sigmoidoscope) is used to examine your rectum and lower colon. The sigmoidoscope is inserted through your anus into your rectum and lower colon. ? Colonoscopy. During this test, a long, thin, flexible tube with a tiny camera (colonoscope) is used to examine your entire colon and rectum.  Hepatitis C blood test.  Hepatitis B blood test.  Sexually transmitted disease (STD) testing.  Diabetes screening. This is done by checking your blood sugar (glucose) after you have not eaten for a while (fasting). You may have this done every 1-3 years.  Bone density scan. This is done to screen for osteoporosis. You may have this done starting at age 83.  Mammogram. This may be done every 1-2 years. Talk to your health care provider about how often you should have regular mammograms. Talk with your health care provider about your test results, treatment options, and if necessary, the need for  more tests. Vaccines Your health care provider may recommend certain vaccines, such as:  Influenza vaccine. This is recommended every year.  Tetanus, diphtheria, and acellular pertussis (Tdap, Td) vaccine. You may need a Td booster every 10 years.  Varicella vaccine. You may need this if you have not been  vaccinated.  Zoster vaccine. You may need this after age 83.  Measles, mumps, and rubella (MMR) vaccine. You may need at least one dose of MMR if you were born in 1957 or later. You may also need a second dose.  Pneumococcal 13-valent conjugate (PCV13) vaccine. One dose is recommended after age 83.  Pneumococcal polysaccharide (PPSV23) vaccine. One dose is recommended after age 83.  Meningococcal vaccine. You may need this if you have certain conditions.  Hepatitis A vaccine. You may need this if you have certain conditions or if you travel or work in places where you may be exposed to hepatitis A.  Hepatitis B vaccine. You may need this if you have certain conditions or if you travel or work in places where you may be exposed to hepatitis B.  Haemophilus influenzae type b (Hib) vaccine. You may need this if you have certain conditions. Talk to your health care provider about which screenings and vaccines you need and how often you need them. This information is not intended to replace advice given to you by your health care provider. Make sure you discuss any questions you have with your health care provider. Document Released: 11/24/2015 Document Revised: 12/18/2017 Document Reviewed: 08/29/2015 Elsevier Interactive Patient Education  2019 [place] Prevention in the Home, Adult Falls can cause injuries. They can happen to people of all ages. There are many things you can do to make your home safe and to help prevent falls. Ask for help when making these changes, if needed. What actions can I take to prevent falls? General Instructions  Use good lighting in all rooms. Replace any light bulbs that burn out.  Turn on the lights when you go into a dark area. Use night-lights.  Keep items that you use often in easy-to-reach places. Lower the shelves around your home if necessary.  Set up your furniture so you have a clear path. Avoid moving your furniture around.  Do not  have throw rugs and other things on the floor that can make you trip.  Avoid walking on wet floors.  If any of your floors are uneven, fix them.  Add color or contrast paint or tape to clearly mark and help you see: ? Any grab bars or handrails. ? First and last steps of stairways. ? Where the edge of each step is.  If you use a stepladder: ? Make sure that it is fully opened. Do not climb a closed stepladder. ? Make sure that both sides of the stepladder are locked into place. ? Ask someone to hold the stepladder for you while you use it.  If there are any pets around you, be aware of where they are. What can I do in the bathroom?      Keep the floor dry. Clean up any water that spills onto the floor as soon as it happens.  Remove soap buildup in the tub or shower regularly.  Use non-skid mats or decals on the floor of the tub or shower.  Attach bath mats securely with double-sided, non-slip rug tape.  If you need to sit down in the shower, use a plastic, non-slip stool.  Install grab  bars by the toilet and in the tub and shower. Do not use towel bars as grab bars. What can I do in the bedroom?  Make sure that you have a light by your bed that is easy to reach.  Do not use any sheets or blankets that are too big for your bed. They should not hang down onto the floor.  Have a firm chair that has side arms. You can use this for support while you get dressed. What can I do in the kitchen?  Clean up any spills right away.  If you need to reach something above you, use a strong step stool that has a grab bar.  Keep electrical cords out of the way.  Do not use floor polish or wax that makes floors slippery. If you must use wax, use non-skid floor wax. What can I do with my stairs?  Do not leave any items on the stairs.  Make sure that you have a light switch at the top of the stairs and the bottom of the stairs. If you do not have them, ask someone to add them for  you.  Make sure that there are handrails on both sides of the stairs, and use them. Fix handrails that are broken or loose. Make sure that handrails are as long as the stairways.  Install non-slip stair treads on all stairs in your home.  Avoid having throw rugs at the top or bottom of the stairs. If you do have throw rugs, attach them to the floor with carpet tape.  Choose a carpet that does not hide the edge of the steps on the stairway.  Check any carpeting to make sure that it is firmly attached to the stairs. Fix any carpet that is loose or worn. What can I do on the outside of my home?  Use bright outdoor lighting.  Regularly fix the edges of walkways and driveways and fix any cracks.  Remove anything that might make you trip as you walk through a door, such as a raised step or threshold.  Trim any bushes or trees on the path to your home.  Regularly check to see if handrails are loose or broken. Make sure that both sides of any steps have handrails.  Install guardrails along the edges of any raised decks and porches.  Clear walking paths of anything that might make someone trip, such as tools or rocks.  Have any leaves, snow, or ice cleared regularly.  Use sand or salt on walking paths during winter.  Clean up any spills in your garage right away. This includes grease or oil spills. What other actions can I take?  Wear shoes that: ? Have a low heel. Do not wear high heels. ? Have rubber bottoms. ? Are comfortable and fit you well. ? Are closed at the toe. Do not wear open-toe sandals.  Use tools that help you move around (mobility aids) if they are needed. These include: ? Canes. ? Walkers. ? Scooters. ? Crutches.  Review your medicines with your doctor. Some medicines can make you feel dizzy. This can increase your chance of falling. Ask your doctor what other things you can do to help prevent falls. Where to find more information  Centers for Disease Control  and Prevention, STEADI: https://garcia.biz/  [employer] on Aging: BrainJudge.co.uk Contact a doctor if:  You are afraid of falling at home.  You feel weak, drowsy, or dizzy at home.  You fall at home. Summary  There are many simple things that you can do to make your home safe and to help prevent falls.  Ways to make your home safe include removing tripping hazards and installing grab bars in the bathroom.  Ask for help when making these changes in your home. This information is not intended to replace advice given to you by your health care provider. Make sure you discuss any questions you have with your health care provider. Document Released: 2020-09-1309 Document Revised: 06/12/2017 Document Reviewed: 06/12/2017 Elsevier Interactive Patient Education  2019 [employer].

## 2019-01-05 NOTE — Progress Notes (Addendum)
Subjective:   Courtney Frank is a 85 y.o. female who presents for a Subsequent Medicare Annual Wellness Visit.  Courtney Frank is accompanied today by her caregiver, Kendal Hymen.  She lives in a 2 story home, but never goes downstairs due to balance issues.  Kendal Hymen or another caregiver stay with patient at all times.  They help her with ADLs and provide transportation to all appointments and errands.  Courtney Frank has 2 sons, one son had cancer and is deceased, her other son lives in [place].  She has 2 grandchildren.    Patient Care Team: Ernestina Penna, MD as PCP - General (Family Medicine)  Hospitalizations, surgeries, and ER visits in previous 12 months No hospitalizations, ER visits, or surgeries this past year.   Review of Systems    Patient reports that her overall health is unchanged compared to last year.  Cardiac Risk Factors include: advanced age (>29men, >91 women);dyslipidemia;sedentary lifestyle  All other systems negative       Current Medications (verified) Outpatient Encounter Medications as of 01/05/2019  Medication Sig  . acetaminophen (TYLENOL) 325 MG tablet Take 650 mg by mouth every 6 (six) hours as needed.    . calcium carbonate (OS-CAL) 600 MG TABS Take 600 mg by mouth 2 (two) times daily with a meal.    . Cholecalciferol (VITAMIN D3) 1000 UNITS CAPS Take 1 capsule by mouth daily.    Marland Kitchen ezetimibe (ZETIA) 10 MG tablet TAKE 1 TABLET BY MOUTH ONCE DAILY  . omeprazole (PRILOSEC) 40 MG capsule TAKE ONE CAPSULE BY MOUTH ONCE DAILY 1  HOUR  BEFORE  A  MEAL  . oxybutynin (DITROPAN-XL) 10 MG 24 hr tablet TAKE ONE TABLET BY MOUTH ONCE DAILY  . simvastatin (ZOCOR) 80 MG tablet TAKE ONE TABLET BY MOUTH ONCE DAILY   No facility-administered encounter medications on file as of 01/05/2019.     Allergies (verified) Aricept [donepezil hcl]; Detrol [tolterodine tartrate]; Duratuss [phenylephrine-guaifenesin]; and Remeron [mirtazapine]   History: Past Medical History:    Diagnosis Date  . Back pain   . Cataracts, bilateral   . Dementia (HCC)   . GI bleed   . Incontinence   . Inguinal hernia   . Other and unspecified hyperlipidemia    Past Surgical History:  Procedure Laterality Date  . CATARACT EXTRACTION W/ INTRAOCULAR LENS  IMPLANT, BILATERAL    . rt inguinal hernia repair     Family History  Problem Relation Age of Onset  . Heart attack Mother   . Healthy Father   . Cancer Son    Social History   Socioeconomic History  . Marital status: Widowed    Spouse name: Not on file  . Number of children: 2  . Years of education: Not on file  . Highest education level: Bachelor's degree (e.g., BA, AB, BS)  Occupational History  . Occupation: Retired     Comment: Secretary/administrator  . Financial resource strain: Not hard at all  . Food insecurity:    Worry: Never true    Inability: Never true  . Transportation needs:    Medical: No    Non-medical: No  Tobacco Use  . Smoking status: Never Smoker  . Smokeless tobacco: Never Used  Substance and Sexual Activity  . Alcohol use: No  . Drug use: No  . Sexual activity: Not on file  Lifestyle  . Physical activity:    Days per week: 0 days    Minutes per session: 0  min  . Stress: Not at all  Relationships  . Social connections:    Talks on phone: Once a week    Gets together: More than three times a week    Attends religious service: Never    Active member of club or organization: No    Attends meetings of clubs or organizations: Never    Relationship status: Widowed  Other Topics Concern  . Not on file  Social History Narrative  . Not on file     Clinical Intake:     Pain Score: 0-No pain                  Activities of Daily Living In your present state of health, do you have any difficulty performing the following activities: 01/05/2019  Hearing? N  Vision? N  Difficulty concentrating or making decisions? N  Comment Per patient  Walking or climbing stairs? Y   Comment Due to balance issues  Dressing or bathing? Y  Comment Caregiver assists   Doing errands, shopping? Y  Comment Caregiver assists and provides Wellsite geologist and eating ? Y  Comment Caregiver assists   Using the Toilet? N  In the past six months, have you accidently leaked urine? Y  Comment Wears depends  Do you have problems with loss of bowel control? Y  Comment Wears depends  Managing your Medications? Y  Comment Caregiver assists  Managing your Finances? Y  Comment Bills are directly deposited  Housekeeping or managing your Housekeeping? Y  Comment Caregiver assists  Some recent data might be hidden     Exercise Current Exercise Habits: The patient does not participate in regular exercise at present, Exercise limited by: orthopedic condition(s);Other - see comments(Balance problems)  Diet Consumes 3 meals a day and 1 snacks a day.  The patient feels that she mostly follow a Regular diet.  Diet History Patient's caregiver, Kendal Hymen, states she prepares all of Courtney Frank's meals.  For breakfast she usually has eggs, sausage, toast and fruit, for lunch usually a sandwich and soup, and supper usually chicken and vegetables.  Patient has access to all the food she needs.   Depression Screen PHQ 2/9 Scores 01/05/2019 12/17/2018 07/15/2018 01/28/2018 09/24/2017 02/19/2017 02/12/2017  PHQ - 2 Score 0 0 1 1  PHQ- 9 Score 10 - 18 - - - -     Fall Risk Fall Risk  01/05/2019 12/17/2018 07/15/2018 04/10/2018 01/28/2018  Falls in the past year? 1 1 Yes Yes No  Number falls in past yr: 2 or more 1 2 or more 1 -  Injury with Fall? 1 0 Yes No -  Comment Left wrist fracture - - - -  Risk Factor Category  - - - - -  Risk for fall due to : Impaired balance/gait - - - -     Objective:    Today's Vitals   01/05/19 1412  BP: 108/64  Pulse: 79  Weight: 104 lb (47.2 kg)  Height:  (1.575 m)  PainSc: 0-No pain   Body mass index is 19.02 kg/m.  Advanced  Directives 01/05/2019 09/08/2016  Does Patient Have a Medical Advance Directive? Yes Yes  Type of Estate agent of [place];Living will -  Does patient want to make changes to medical advance directive? No - Patient declined -  Copy of Healthcare Power of Attorney in Chart? No - copy requested -    Hearing/Vision  No hearing or vision deficits  noted during visit.  Cognitive Function: MMSE - Mini Mental State Exam 01/05/2019 11/21/2016  Not completed: Unable to complete -  Orientation to time - 1  Orientation to Place - 5  Registration - 3  Attention/ Calculation - 5  Recall - 0  Language- name 2 objects - 2  Language- repeat - 1  Language- follow 3 step command - 3  Language- read & follow direction - 0  Write a sentence - 1  Copy design - 1  Total score - 22    Attempted, but unable to complete.        Immunizations and Health Maintenance Immunization History  Administered Date(s) Administered  . Influenza Whole 08/11/2010  . Influenza, High Dose Seasonal PF 10/25/2015, 08/21/2016, 09/24/2017, 08/18/2018  . Influenza,inj,Quad PF,6+ Mos 08/04/2013, 09/07/2014  . PPD Test 06/25/2016  . Pneumococcal Conjugate-13 11/17/2013  . Pneumococcal Polysaccharide-23 11/11/1996  . Td 05/11/2006   Health Maintenance Due  Topic Date Due  . TETANUS/TDAP  05/11/2016   Patient and caregiver are aware that she is due for a tetanus vaccine, and were advised to bring patient to the office for an appointment if she sustains any sort of injury with a laceration or puncture wound.  Health Maintenance  Topic Date Due  . TETANUS/TDAP  05/11/2016  . INFLUENZA VACCINE  Completed  . DEXA SCAN  Completed  . PNA vac Low Risk Adult  Completed        Assessment:   This is a routine wellness examination for Courtney Frank.    Plan:    Goals    . Prevent falls     Use cane and walker at all times, and continue to move carefully to avoid falls.         Health Maintenance  & Additional Screening Recommendations  Advanced directives: has an advanced directive - a copy HAS NOT been provided.  Lung: Low Dose CT Chest recommended if Age 5-80 years, 30 pack-year currently smoking OR have quit w/in 15years. Patient does not qualify. Hepatitis C Screening recommended: not applicable    Plan  Keep f/u with Ernestina Penna, MD and any other specialty appointments you may have Continue current medications Move carefully to avoid falls. Use assistive devices like a cane or walker if needed. Read or work on puzzles daily Stay connected with friends and family  I have personally reviewed and noted the following in the patient's chart:   . Medical and social history . Use of alcohol, tobacco or illicit drugs  . Current medications and supplements . Functional ability and status . Nutritional status . Physical activity . Advanced directives . List of other physicians . Hospitalizations, surgeries, and ER visits in previous 12 months . Vitals . Screenings to include cognitive, depression, and falls . Referrals and appointments  In addition, I have reviewed and discussed with patient certain preventive protocols, quality metrics, and best practice recommendations. A written personalized care plan for preventive services as well as general preventive health recommendations were provided to patient.     Lilia Argue, RN  01/05/2019  I have reviewed and agree with the above AWV documentation.   Nyra Capes MD

## 2019-01-16 ENCOUNTER — Other Ambulatory Visit: Payer: Self-pay | Admitting: Family Medicine

## 2019-02-06 ENCOUNTER — Other Ambulatory Visit: Payer: Self-pay | Admitting: Family Medicine

## 2019-02-09 ENCOUNTER — Other Ambulatory Visit: Payer: Self-pay | Admitting: Family Medicine

## 2019-02-17 ENCOUNTER — Other Ambulatory Visit: Payer: Self-pay | Admitting: Family Medicine

## 2019-03-19 ENCOUNTER — Other Ambulatory Visit: Payer: Self-pay | Admitting: Family Medicine

## 2019-04-14 ENCOUNTER — Ambulatory Visit (INDEPENDENT_AMBULATORY_CARE_PROVIDER_SITE_OTHER): Payer: Medicare Other | Admitting: Family Medicine

## 2019-04-14 ENCOUNTER — Other Ambulatory Visit: Payer: Self-pay

## 2019-04-14 ENCOUNTER — Encounter: Payer: Self-pay | Admitting: Family Medicine

## 2019-04-14 DIAGNOSIS — E7800 Pure hypercholesterolemia, unspecified: Secondary | ICD-10-CM | POA: Diagnosis not present

## 2019-04-14 DIAGNOSIS — H02403 Unspecified ptosis of bilateral eyelids: Secondary | ICD-10-CM

## 2019-04-14 DIAGNOSIS — E559 Vitamin D deficiency, unspecified: Secondary | ICD-10-CM | POA: Diagnosis not present

## 2019-04-14 DIAGNOSIS — H539 Unspecified visual disturbance: Secondary | ICD-10-CM | POA: Diagnosis not present

## 2019-04-14 DIAGNOSIS — H0233 Blepharochalasis right eye, unspecified eyelid: Secondary | ICD-10-CM

## 2019-04-14 DIAGNOSIS — R413 Other amnesia: Secondary | ICD-10-CM | POA: Diagnosis not present

## 2019-04-14 DIAGNOSIS — R634 Abnormal weight loss: Secondary | ICD-10-CM | POA: Diagnosis not present

## 2019-04-14 NOTE — Progress Notes (Signed)
Virtual Visit Via telephone Note I connected with@ on 04/14/19 by telephone and verified that I am speaking with the correct person or authorized healthcare agent using two identifiers. Courtney Frank is currently located at home and there are no unauthorized people in close proximity. I completed this visit while in a private location in my home .  This visit type was conducted due to national recommendations for restrictions regarding the COVID-19 Pandemic (e.g. social distancing).  This format is felt to be most appropriate for this patient at this time.  All issues noted in this document were discussed and addressed.  No physical exam was performed.    I discussed the limitations, risks, security and privacy concerns of performing an evaluation and management service by telephone and the availability of in person appointments. I also discussed with the patient that there may be a patient responsible charge related to this service. The patient expressed understanding and agreed to proceed.   Date:  04/14/2019    ID:  Courtney Frank      [birth year]        [id number]   Patient Care Team Patient Care Team: Ernestina Penna, MD as PCP - General (Family Medicine)  Reason for Visit: Primary Care Follow-up     History of Present Illness & Review of Systems:     Courtney Frank is a 85 y.o. year old female primary care patient that presents today for a telehealth visit.  The patient has 2 caregivers.  One is Kendal Hymen and one is [employer].  When was with her today and had the phone so that the speaker phone was working and the patient seem to hear me much better.  She has not had any falls and she is taking fluids well and has a good appetite.  She denies any chest pain pressure tightness or shortness of breath.  She is not having any trouble with her stomach including nausea vomiting diarrhea blood in the stool or black tarry bowel movements and is passing her water without complaint though  frequently.  She has no musculoskeletal issues.  She does have some headache and the eye doctors have attributed this to a retinal detachment.  Review of systems as stated, otherwise negative.  The patient does not have symptoms concerning for COVID-19 infection (fever, chills, cough, or new shortness of breath).      Current Medications (Verified) Allergies as of 04/14/2019      Reactions   Aricept [donepezil Hcl]    Makes very sleepy   Detrol [tolterodine Tartrate]    Duratuss [phenylephrine-guaifenesin]    Remeron [mirtazapine]    Keep awake, and very "out of it"      Medication List       Accurate as of April 14, 2019 12:34 PM. If you have any questions, ask your nurse or doctor.        acetaminophen 325 MG tablet Commonly known as:  TYLENOL Take 650 mg by mouth every 6 (six) hours as needed.   calcium carbonate 600 MG Tabs tablet Commonly known as:  OS-CAL Take 600 mg by mouth 2 (two) times daily with a meal.   ezetimibe 10 MG tablet Commonly known as:  ZETIA Take 1 tablet by mouth once daily   omeprazole 40 MG capsule Commonly known as:  PRILOSEC TAKE 1 CAPSULE BY MOUTH ONCE DAILY 1 HOUR BEFORE A MEAL   oxybutynin 10 MG 24 hr tablet Commonly known as:  DITROPAN-XL  Take 1 tablet by mouth once daily   simvastatin 80 MG tablet Commonly known as:  ZOCOR Take 1 tablet by mouth once daily   Vitamin D3 25 MCG (1000 UT) Caps Take 1 capsule by mouth daily.           Allergies (Verified)    Aricept [donepezil hcl]; Detrol [tolterodine tartrate]; Duratuss [phenylephrine-guaifenesin]; and Remeron [mirtazapine]  Past Medical History Past Medical History:  Diagnosis Date  . Back pain   . Cataracts, bilateral   . Dementia (HCC)   . GI bleed   . Incontinence   . Inguinal hernia   . Other and unspecified hyperlipidemia      Past Surgical History:  Procedure Laterality Date  . CATARACT EXTRACTION W/ INTRAOCULAR LENS  IMPLANT, BILATERAL    . rt inguinal hernia  repair      Social History   Socioeconomic History  . Marital status: Widowed    Spouse name: Not on file  . Number of children: 2  . Years of education: Not on file  . Highest education level: Bachelor's degree (e.g., BA, AB, BS)  Occupational History  . Occupation: Retired     Comment: Secretary/administrator  . Financial resource strain: Not hard at all  . Food insecurity:    Worry: Never true    Inability: Never true  . Transportation needs:    Medical: No    Non-medical: No  Tobacco Use  . Smoking status: Never Smoker  . Smokeless tobacco: Never Used  Substance and Sexual Activity  . Alcohol use: No  . Drug use: No  . Sexual activity: Not on file  Lifestyle  . Physical activity:    Days per week: 0 days    Minutes per session: 0 min  . Stress: Not at all  Relationships  . Social connections:    Talks on phone: Once a week    Gets together: More than three times a week    Attends religious service: Never    Active member of club or organization: No    Attends meetings of clubs or organizations: Never    Relationship status: Widowed  Other Topics Concern  . Not on file  Social History Narrative  . Not on file     Family History  Problem Relation Age of Onset  . Heart attack Mother   . Healthy Father   . Cancer Son       Labs/Other Tests and Data Reviewed:    Wt Readings from Last 3 Encounters:  01/05/19 104 lb (47.2 kg)  12/17/18 104 lb (47.2 kg)  07/15/18 99 lb (44.9 kg)   Temp Readings from Last 3 Encounters:  12/17/18 97.6 F (36.4 C) (Oral)  07/15/18 (!) 96.9 F (36.1 C) (Oral)  04/10/18 (!) 97 F (36.1 C) (Oral)   BP Readings from Last 3 Encounters:  01/05/19 108/64  12/17/18 113/62  07/15/18 124/70   Pulse Readings from Last 3 Encounters:  01/05/19 79  12/17/18 78  07/15/18 65     No results found for: HGBA1C Lab Results  Component Value Date   LDLCALC 67 12/17/2018   CREATININE 0.67 12/17/2018       Chemistry       Component Value Date/Time   NA 146 (H) 12/17/2018 1417   K 4.3 12/17/2018 1417   CL 104 12/17/2018 1417   CO2 27 12/17/2018 1417   BUN 17 12/17/2018 1417   CREATININE 0.67 12/17/2018 1417  Component Value Date/Time   CALCIUM 9.3 12/17/2018 1417   ALKPHOS 84 12/17/2018 1417   AST 13 12/17/2018 1417   ALT 9 12/17/2018 1417   BILITOT <0.2 12/17/2018 1417         OBSERVATIONS/ OBJECTIVE:     The patient was pleasant and heard all of my comments by way of her caregiver.  She did respond appropriately to questions asked of her.  She is 85 years old and is monitored closely by caregivers and apparently is doing well as far as her skin no falls plenty of fluids and good appetite.  Physical exam deferred due to nature of telephonic visit.  ASSESSMENT & PLAN    Time:   Today, I have spent 21 minutes with the patient via telephone discussing the above including Covid precautions.     Visit Diagnoses: 1. Weight loss, unintentional -The patient has a good appetite.  She is now 85 years old.  Her caregiver says she is drinking plenty of fluids.  2. Visual disturbance -She continues to have visual issues and is assisted pretty much 24 hours/day.  3. Redundant eyelid of both eyes -Follow-up with ophthalmology as needed  4. Vitamin D deficiency -Continue with vitamin D replacement pending results of lab work  5. Memory impairment of gradual onset -Patient was alert during the visit and responded appropriately to questions asked of her.  I actually called earlier and she answered the phone but could not seem to understand what I was saying but she was very clear on this particular phone call.  6. Pure hypercholesterolemia -SHe is off of the simvastatin and watching her diet closely and only taking Zetia.  Patient Instructions  Continue with current treatment Please be careful with visitors to the home and make sure that they use hand gels and masks before letting them in the  home to visit with the patient Continue drinking plenty of water and fluids Continue to be careful and not put self at risk for falling  Practice good hand and respiratory hygiene     The above assessment and management plan was discussed with the patient. The patient verbalized understanding of and has agreed to the management plan. Patient is aware to call the clinic if symptoms persist or worsen. Patient is aware when to return to the clinic for a follow-up visit. Patient educated on when it is appropriate to go to the emergency department.    Ernestina Pennaonald W. Moore, MD [facility] [address], [place], [place] [id number] Ph [phone]   Nyra Capeson W. Moore MD

## 2019-04-14 NOTE — Patient Instructions (Signed)
Continue with current treatment Please be careful with visitors to the home and make sure that they use hand gels and masks before letting them in the home to visit with the patient Continue drinking plenty of water and fluids Continue to be careful and not put self at risk for falling  Practice good hand and respiratory hygiene

## 2019-04-14 NOTE — Addendum Note (Signed)
Addended by: Magdalene River on: 04/14/2019 03:06 PM   Modules accepted: Orders

## 2019-04-14 NOTE — Addendum Note (Signed)
Addended by: Magdalene River on: 04/14/2019 02:46 PM   Modules accepted: Orders

## 2019-04-14 NOTE — Addendum Note (Signed)
Addended by: Prescott Gum on: 04/14/2019 03:15 PM   Modules accepted: Orders

## 2019-04-16 ENCOUNTER — Other Ambulatory Visit: Payer: Self-pay | Admitting: Family Medicine

## 2019-04-16 LAB — CBC WITH DIFFERENTIAL/PLATELET

## 2019-04-22 LAB — THYROID PANEL WITH TSH
Free Thyroxine Index: 2.3 (ref 1.2–4.9)
T3 Uptake Ratio: 26 % (ref 24–39)
T4, Total: 9 ug/dL (ref 4.5–12.0)
TSH: 2.39 u[IU]/mL (ref 0.450–4.500)

## 2019-04-22 LAB — BMP8+EGFR
BUN/Creatinine Ratio: 16 (ref 12–28)
BUN: 12 mg/dL (ref 10–36)
CO2: 25 mmol/L (ref 20–29)
Calcium: 9.5 mg/dL (ref 8.7–10.3)
Chloride: 100 mmol/L (ref 96–106)
Creatinine, Ser: 0.76 mg/dL (ref 0.57–1.00)
GFR calc Af Amer: 73 mL/min/{1.73_m2} (ref >59)
GFR calc non Af Amer: 64 mL/min/{1.73_m2} (ref >59)
Glucose: 129 mg/dL — ABNORMAL HIGH (ref 65–99)
Potassium: 4 mmol/L (ref 3.5–5.2)
Sodium: 140 mmol/L (ref 134–144)

## 2019-04-22 LAB — CBC WITH DIFFERENTIAL/PLATELET

## 2019-04-22 LAB — LIPID PANEL
Chol/HDL Ratio: 2.7 ratio (ref 0.0–4.4)
Cholesterol, Total: 147 mg/dL (ref 100–199)
HDL: 54 mg/dL (ref >39)
LDL Calculated: 65 mg/dL (ref 0–99)
Triglycerides: 141 mg/dL (ref 0–149)
VLDL Cholesterol Cal: 28 mg/dL (ref 5–40)

## 2019-04-22 LAB — HEPATIC FUNCTION PANEL
ALT: 13 IU/L (ref 0–32)
AST: 19 IU/L (ref 0–40)
Albumin: 3.6 g/dL (ref 3.5–4.6)
Alkaline Phosphatase: 102 IU/L (ref 39–117)
Bilirubin Total: 0.2 mg/dL (ref 0.0–1.2)
Bilirubin, Direct: 0.08 mg/dL (ref 0.00–0.40)
Total Protein: 6.3 g/dL (ref 6.0–8.5)

## 2019-04-22 LAB — VITAMIN D 25 HYDROXY (VIT D DEFICIENCY, FRACTURES): Vit D, 25-Hydroxy: 25.1 ng/mL — ABNORMAL LOW (ref 30.0–100.0)

## 2019-05-10 ENCOUNTER — Telehealth: Payer: Self-pay | Admitting: Family Medicine

## 2019-05-10 ENCOUNTER — Telehealth: Payer: Self-pay | Admitting: *Deleted

## 2019-05-10 DIAGNOSIS — S31000A Unspecified open wound of lower back and pelvis without penetration into retroperitoneum, initial encounter: Secondary | ICD-10-CM

## 2019-05-10 MED ORDER — DUODERM HYDROACTIVE EX GEL: CUTANEOUS | 3 refills | Status: AC

## 2019-05-10 NOTE — Telephone Encounter (Signed)
Pt has a small pressure wound above tail bone Approximate size of open area pencil eraser size Please advise

## 2019-05-10 NOTE — Telephone Encounter (Signed)
Per Zigmund Daniel and Rakes = telfa / tegaderm was ordered

## 2019-05-10 NOTE — Telephone Encounter (Signed)
Pt has sacral pressure wound Per L Monia Pouch, pt needs RX for Duoderm RX sent to [facility]

## 2019-05-10 NOTE — Telephone Encounter (Signed)
Please arrange for home health to go by and evaluate patient and begin treatment.  Let patient and caregiver know this is going to happen.

## 2019-05-15 ENCOUNTER — Encounter (HOSPITAL_COMMUNITY): Payer: Self-pay | Admitting: Emergency Medicine

## 2019-05-15 ENCOUNTER — Emergency Department (HOSPITAL_COMMUNITY): Payer: Medicare Other

## 2019-05-15 ENCOUNTER — Other Ambulatory Visit: Payer: Self-pay

## 2019-05-15 ENCOUNTER — Emergency Department (HOSPITAL_COMMUNITY)
Admission: EM | Admit: 2019-05-15 | Discharge: 2019-05-15 | Disposition: A | Discharge status: Home / Self Care | Payer: Medicare Other | Attending: Emergency Medicine | Admitting: Emergency Medicine

## 2019-05-15 DIAGNOSIS — R0902 Hypoxemia: Secondary | ICD-10-CM | POA: Diagnosis not present

## 2019-05-15 DIAGNOSIS — G939 Disorder of brain, unspecified: Secondary | ICD-10-CM | POA: Diagnosis not present

## 2019-05-15 DIAGNOSIS — R0689 Other abnormalities of breathing: Secondary | ICD-10-CM | POA: Diagnosis not present

## 2019-05-15 DIAGNOSIS — R531 Weakness: Secondary | ICD-10-CM | POA: Diagnosis not present

## 2019-05-15 DIAGNOSIS — Z79899 Other long term (current) drug therapy: Secondary | ICD-10-CM | POA: Diagnosis not present

## 2019-05-15 DIAGNOSIS — F0390 Unspecified dementia without behavioral disturbance: Secondary | ICD-10-CM | POA: Diagnosis not present

## 2019-05-15 DIAGNOSIS — E1165 Type 2 diabetes mellitus with hyperglycemia: Secondary | ICD-10-CM | POA: Diagnosis not present

## 2019-05-15 DIAGNOSIS — N3000 Acute cystitis without hematuria: Secondary | ICD-10-CM | POA: Diagnosis not present

## 2019-05-15 DIAGNOSIS — I959 Hypotension, unspecified: Secondary | ICD-10-CM | POA: Diagnosis present

## 2019-05-15 DIAGNOSIS — Z20828 Contact with and (suspected) exposure to other viral communicable diseases: Secondary | ICD-10-CM | POA: Insufficient documentation

## 2019-05-15 DIAGNOSIS — Z209 Contact with and (suspected) exposure to unspecified communicable disease: Secondary | ICD-10-CM | POA: Diagnosis not present

## 2019-05-15 DIAGNOSIS — G936 Cerebral edema: Secondary | ICD-10-CM | POA: Diagnosis not present

## 2019-05-15 DIAGNOSIS — T671XXA Heat syncope, initial encounter: Secondary | ICD-10-CM | POA: Diagnosis not present

## 2019-05-15 DIAGNOSIS — G9389 Other specified disorders of brain: Secondary | ICD-10-CM

## 2019-05-15 DIAGNOSIS — R900 Intracranial space-occupying lesion found on diagnostic imaging of central nervous system: Secondary | ICD-10-CM | POA: Insufficient documentation

## 2019-05-15 DIAGNOSIS — N390 Urinary tract infection, site not specified: Secondary | ICD-10-CM | POA: Diagnosis not present

## 2019-05-15 LAB — COMPREHENSIVE METABOLIC PANEL
ALT: 19 U/L (ref 0–44)
AST: 23 U/L (ref 15–41)
Albumin: 3.3 g/dL — ABNORMAL LOW (ref 3.5–5.0)
Alkaline Phosphatase: 77 U/L (ref 38–126)
Anion gap: 11 (ref 5–15)
BUN: 16 mg/dL (ref 8–23)
CO2: 26 mmol/L (ref 22–32)
Calcium: 9.3 mg/dL (ref 8.9–10.3)
Chloride: 102 mmol/L (ref 98–111)
Creatinine, Ser: 0.9 mg/dL (ref 0.44–1.00)
GFR calc Af Amer: 60 mL/min — ABNORMAL LOW (ref >60)
GFR calc non Af Amer: 52 mL/min — ABNORMAL LOW (ref >60)
Glucose, Bld: 177 mg/dL — ABNORMAL HIGH (ref 70–99)
Potassium: 4.2 mmol/L (ref 3.5–5.1)
Sodium: 139 mmol/L (ref 135–145)
Total Bilirubin: 0.5 mg/dL (ref 0.3–1.2)
Total Protein: 6.3 g/dL — ABNORMAL LOW (ref 6.5–8.1)

## 2019-05-15 LAB — URINALYSIS, ROUTINE W REFLEX MICROSCOPIC
Bilirubin Urine: NEGATIVE
Glucose, UA: NEGATIVE mg/dL
Ketones, ur: NEGATIVE mg/dL
Nitrite: NEGATIVE
Protein, ur: NEGATIVE mg/dL
Specific Gravity, Urine: 1.009 (ref 1.005–1.030)
pH: 6 (ref 5.0–8.0)

## 2019-05-15 LAB — CBC WITH DIFFERENTIAL/PLATELET
Abs Immature Granulocytes: 0.01 10*3/uL (ref 0.00–0.07)
Basophils Absolute: 0 10*3/uL (ref 0.0–0.1)
Basophils Relative: 0 %
Eosinophils Absolute: 0 10*3/uL (ref 0.0–0.5)
Eosinophils Relative: 0 %
HCT: 43.7 % (ref 36.0–46.0)
Hemoglobin: 13.5 g/dL (ref 12.0–15.0)
Immature Granulocytes: 0 %
Lymphocytes Relative: 10 %
Lymphs Abs: 0.5 10*3/uL — ABNORMAL LOW (ref 0.7–4.0)
MCH: 31.3 pg (ref 26.0–34.0)
MCHC: 30.9 g/dL (ref 30.0–36.0)
MCV: 101.2 fL — ABNORMAL HIGH (ref 80.0–100.0)
Monocytes Absolute: 0.4 10*3/uL (ref 0.1–1.0)
Monocytes Relative: 9 %
Neutro Abs: 3.7 10*3/uL (ref 1.7–7.7)
Neutrophils Relative %: 81 %
Platelets: 170 10*3/uL (ref 150–400)
RBC: 4.32 MIL/uL (ref 3.87–5.11)
RDW: 13.2 % (ref 11.5–15.5)
WBC: 4.6 10*3/uL (ref 4.0–10.5)
nRBC: 0 % (ref 0.0–0.2)

## 2019-05-15 LAB — SARS CORONAVIRUS 2 BY RT PCR (HOSPITAL ORDER, PERFORMED IN ~~LOC~~ HOSPITAL LAB): SARS Coronavirus 2: NEGATIVE

## 2019-05-15 LAB — TROPONIN I (HIGH SENSITIVITY)
Troponin I (High Sensitivity): <2 ng/L (ref <18)
Troponin I (High Sensitivity): <2 ng/L (ref <18)

## 2019-05-15 MED ORDER — CEPHALEXIN 500 MG PO CAPS
500.0000 mg | ORAL_CAPSULE | Freq: Once | ORAL | Status: AC
  Administered 2019-05-15: 500 mg via ORAL
  Filled 2019-05-15: qty 1

## 2019-05-15 MED ORDER — CEPHALEXIN 500 MG PO CAPS: 500.0000 mg | ORAL_CAPSULE | Freq: Four times a day (QID) | ORAL | 0 refills | Status: AC

## 2019-05-15 MED ORDER — CEPHALEXIN 500 MG PO CAPS: 500.0000 mg | ORAL_CAPSULE | Freq: Four times a day (QID) | ORAL | 0 refills | Status: DC

## 2019-05-15 NOTE — ED Notes (Signed)
Me and Courtney Frank tried to do an In and Out cath but patient refused.

## 2019-05-15 NOTE — ED Triage Notes (Signed)
Pt brought in from home after she experienced a period of not feeling well while sitting at kitchen counter this morning. EMS reports they found her bp to be 80/50 on their arrival with improvement upon lying down.

## 2019-05-15 NOTE — ED Notes (Signed)
Patient placed in gown and on 5 lead at this time. Patient had incontinent of urine. Patient changed. Patient given warm blanket and resting.

## 2019-05-15 NOTE — ED Provider Notes (Signed)
[facility] EMERGENCY DEPARTMENT Provider Note   CSN: [id number] Arrival date & time: 05/15/19  1240     History   Chief Complaint Chief Complaint  Patient presents with  . Hypotension    HPI Courtney Frank is a [age] y.o. female.     The history is provided by a relative and a caregiver. No language interpreter was used.  Weakness Severity:  Mild Onset quality:  Sudden Duration:  1 day Timing:  Constant Progression:  Unchanged Chronicity:  New Relieved by:  Nothing Ineffective treatments:  Lying down Associated symptoms: no abdominal pain   Pt had an episode of not feeling well today.  EMS reports pt's blood pressure was low.   Past Medical History:  Diagnosis Date  . Back pain   . Cataracts, bilateral   . Dementia (HCC)   . GI bleed   . Incontinence   . Inguinal hernia   . Other and unspecified hyperlipidemia     Patient Active Problem List   Diagnosis Date Noted  . Thoracic aortic atherosclerosis (HCC) 09/24/2017  . Retinal hemorrhage noted on examination, right 09/07/2014  . Osteoporosis 03/09/2014  . Vitamin D deficiency 11/16/2013  . Nocturnal leg cramps 11/16/2013  . Insomnia 11/16/2013  . Urine frequency 08/04/2013  . Need for prophylactic vaccination against Streptococcus pneumoniae (pneumococcus) 08/04/2013  . History of GI bleed   . Hyperlipidemia   . Incontinence   . Back pain   . Cataracts, bilateral   . Inguinal hernia     Past Surgical History:  Procedure Laterality Date  . CATARACT EXTRACTION W/ INTRAOCULAR LENS  IMPLANT, BILATERAL    . rt inguinal hernia repair       OB History   No obstetric history on file.      Home Medications    Prior to Admission medications   Medication Sig Start Date End Date Taking? Authorizing Provider  calcium carbonate (OS-CAL) 600 MG TABS Take 600 mg by mouth 2 (two) times daily with a meal.     Yes [provider]  Cholecalciferol (VITAMIN D3) 1000 UNITS CAPS Take 1 capsule by mouth  daily.     Yes [provider]  ezetimibe (ZETIA) 10 MG tablet Take 1 tablet by mouth once daily 03/19/19  Yes Ernestina PennaMoore, Donald W, MD  Hydroactive Dressings (DUODERM HYDROACTIVE) GEL Apply Duoderm to affected area 05/10/19  Yes Rakes, Doralee AlbinoLinda M, FNP  omeprazole (PRILOSEC) 40 MG capsule TAKE 1 CAPSULE BY MOUTH ONCE DAILY 1 HOUR BEFORE A MEAL 04/16/19  Yes Ernestina PennaMoore, Donald W, MD  oxybutynin (DITROPAN-XL) 10 MG 24 hr tablet Take 1 tablet by mouth once daily 04/16/19  Yes Ernestina PennaMoore, Donald W, MD  simvastatin (ZOCOR) 80 MG tablet Take 80 mg by mouth daily.   Yes [provider]  acetaminophen (TYLENOL) 325 MG tablet Take 650 mg by mouth every 6 (six) hours as needed.      [provider]  cephALEXin (KEFLEX) 500 MG capsule Take 1 capsule (500 mg total) by mouth 4 (four) times daily for 10 days. 05/15/19 05/25/19  Elson AreasSofia, Cray Monnin K, PA-C    Family History Family History  Problem Relation Age of Onset  . Heart attack Mother   . Healthy Father   . Cancer Son     Social History Social History   Tobacco Use  . Smoking status: Never Smoker  . Smokeless tobacco: Never Used  Substance Use Topics  . Alcohol use: No  . Drug use: No  Allergies   Aricept [donepezil hcl], Detrol [tolterodine tartrate], Duratuss [phenylephrine-guaifenesin], and Remeron [mirtazapine]   Review of Systems Review of Systems  Gastrointestinal: Negative for abdominal pain.  Neurological: Positive for weakness.  All other systems reviewed and are negative.    Physical Exam Updated Vital Signs BP (!) 111/93 (BP Location: Right Arm)   Pulse (!) 55   Temp 97.9 F (36.6 C) (Oral)   Resp 16   Ht 5' (1.524 m)   Wt 47.2 kg   SpO2 98%   BMI 20.32 kg/m   Physical Exam Vitals signs and nursing note reviewed.  Constitutional:      Appearance: She is well-developed.  HENT:     Head: Normocephalic.  Neck:     Musculoskeletal: Normal range of motion.  Cardiovascular:     Rate and Rhythm: Normal rate.   Pulmonary:     Effort: Pulmonary effort is normal.  Abdominal:     General: There is no distension.  Musculoskeletal: Normal range of motion.  Neurological:     General: No focal deficit present.     Mental Status: She is alert.  Psychiatric:        Mood and Affect: Mood normal.      ED Treatments / Results  Labs (all labs ordered are listed, but only abnormal results are displayed) Labs Reviewed  CBC WITH DIFFERENTIAL/PLATELET - Abnormal; Notable for the following components:      Result Value   MCV 101.2 (*)    Lymphs Abs 0.5 (*)    All other components within normal limits  COMPREHENSIVE METABOLIC PANEL - Abnormal; Notable for the following components:   Glucose, Bld 177 (*)    Total Protein 6.3 (*)    Albumin 3.3 (*)    GFR calc non Af Amer 52 (*)    GFR calc Af Amer 60 (*)    All other components within normal limits  URINALYSIS, ROUTINE W REFLEX MICROSCOPIC - Abnormal; Notable for the following components:   APPearance HAZY (*)    Hgb urine dipstick SMALL (*)    Leukocytes,Ua TRACE (*)    Bacteria, UA FEW (*)    All other components within normal limits  TROPONIN I (HIGH SENSITIVITY)  TROPONIN I (HIGH SENSITIVITY)    EKG EKG Interpretation  Date/Time:  Saturday May 15 2019 12:45:20 EDT Ventricular Rate:  51 PR Interval:    QRS Duration: 80 QT Interval:  435 QTC Calculation: 401 R Axis:   13 Text Interpretation:  Poor data quality Sinus rhythm Low voltage, precordial leads Baseline wander No old tracing to compare Confirmed by Samuel JesterMcManus, Kathleen [phone]([id number]) on 05/15/2019 1:15:36 PM   Radiology Ct Head Wo Contrast  Result Date: 05/15/2019 CLINICAL DATA:  Not feeling well.  Low blood pressure. EXAM: CT HEAD WITHOUT CONTRAST TECHNIQUE: Contiguous axial images were obtained from the base of the skull through the vertex without intravenous contrast. COMPARISON:  None. FINDINGS: Brain: There is a fairly homogeneous slightly hyperdense mass in the middle cranial fossa  with significant surrounding tumoral/vasogenic edema. There is mass effect on the right lateral ventricle with 7 mm of right to left shift. No findings suspicious for hemispheric infarction or intracranial hemorrhage. No extra-axial fluid collections are identified. No downward transtentorial herniation. The brainstem and cerebellum are grossly normal. Vascular: Vascular calcifications but no hyperdense vessels or definite aneurysm. Skull: No skull fracture or bone lesions. Sinuses/Orbits: The paranasal sinuses and mastoid air cells are clear. The globes are intact. Other: No scalp lesions or  hematoma. IMPRESSION: 1. Approximately 4 x 3.2 cm mass in the middle cranial fossa. I think this is most likely extra-axial with a meningioma being most likely. There is fairly significant surrounding vasogenic edema with mass effect on the right lateral ventricle and 7 mm of right-to-left shift. Recommend MRI brain without and with contrast for further evaluation. 2. No CT findings for hemispheric infarction or intracranial hemorrhage. Electronically Signed   By: Marijo Sanes M.D.   On: 05/15/2019 14:51   Dg Chest Port 1 View  Result Date: 05/15/2019 CLINICAL DATA:  Weakness. EXAM: PORTABLE CHEST 1 VIEW COMPARISON:  Radiographs of September 24, 2017. FINDINGS: Stable cardiomegaly. Both lungs are clear. The visualized skeletal structures are unremarkable. IMPRESSION: No active disease. Electronically Signed   By: Marijo Conception M.D.   On: 05/15/2019 14:48    Procedures Procedures (including critical care time)  Medications Ordered in ED Medications  cephALEXin (KEFLEX) capsule 500 mg (has no administration in time range)     Initial Impression / Assessment and Plan / ED Course  I have reviewed the triage vital signs and the nursing notes.  Pertinent labs & imaging results that were available during my care of the patient were reviewed by me and considered in my medical decision making (see chart for details).     I spoke to pt's son  Zakyla Tonche [phone].  I advised him of mass and uti.  I advised him of Radiologist recommendations for MRI and transfer to [facility].  He request pt go to [facility] if possible.  Pt's care giver called and advised that the family would like to take pt home.  They do not want aggressive treatment.   I spoke to Dr. Zada Finders who advised he will follow pt in office.  He advised call for appointment     Final Clinical Impressions(s) / ED Diagnoses   Final diagnoses:  Urinary tract infection without hematuria, site unspecified  Brain mass    ED Discharge Orders         Ordered    cephALEXin (KEFLEX) 500 MG capsule  4 times daily     05/15/19 1903           Sidney Ace 05/15/19 [place], [place], DO 05/17/19 0730

## 2019-05-17 ENCOUNTER — Telehealth: Payer: Self-pay | Admitting: Family Medicine

## 2019-05-17 NOTE — Telephone Encounter (Signed)
Courtney Frank states that patient fainted after breakfast Saturday morning.  EMS was called and evaluated at [facility]. CT scan showed tumor on brain.  Patient alert and oriented.  Daughter states that referral was placed to hospice and Neurology.  Daughter wants clinical direction on what to do now.

## 2019-05-17 NOTE — Telephone Encounter (Signed)
I spoke to and with regards to her aunt's recent CT scan.  They have decided not to pursue any evaluation or treatment for this tumor that was noted on CT scan.  She is aware of referral to hospice but notes that they have not contacted her.  She is acting her normal self so she is not entirely sure that this is something that is needed at this time but rather wants know what signs and symptoms to look for.  I advised her that if she starts developing any neurologic symptoms i.e. imbalance, difficulty with vision, difficulty with speech, severe headache etc. this may indicate that she has increased pressures and need for evaluation.  At this time, they do not want her put her through treatment for this given advanced age.  They do have 24/7 care for her.  They are able to arrange for someone to help in the home as well.  All questions were answered and encouraged her to contact me should she have any other questions or if I can be of any assistance.

## 2019-05-21 ENCOUNTER — Other Ambulatory Visit: Payer: Self-pay | Admitting: Family Medicine

## 2019-05-31 ENCOUNTER — Telehealth: Payer: Self-pay | Admitting: Family Medicine

## 2019-05-31 NOTE — Telephone Encounter (Signed)
lmctb

## 2019-05-31 NOTE — Telephone Encounter (Signed)
Spoke with son - aware of [place] having notes from [facility] and recent CT results. He had questions about possible hospice in the future. She is stable and doing "pretty normal" for her age at this time. Aware he can call us with concerns anytime.

## 2019-06-18 ENCOUNTER — Other Ambulatory Visit: Payer: Self-pay | Admitting: Nurse Practitioner

## 2019-06-18 ENCOUNTER — Other Ambulatory Visit: Payer: Self-pay | Admitting: Family Medicine

## 2019-06-23 ENCOUNTER — Other Ambulatory Visit: Payer: Self-pay | Admitting: Family Medicine

## 2019-07-05 ENCOUNTER — Telehealth: Payer: Self-pay | Admitting: Family Medicine

## 2019-07-05 NOTE — Telephone Encounter (Signed)
Niece POA would like 2 DNR's mailed to her signed

## 2019-07-26 ENCOUNTER — Other Ambulatory Visit: Payer: Self-pay | Admitting: Family Medicine

## 2019-08-06 ENCOUNTER — Telehealth: Payer: Self-pay | Admitting: Family Medicine

## 2019-08-06 ENCOUNTER — Other Ambulatory Visit: Payer: Self-pay | Admitting: Family Medicine

## 2019-08-06 DIAGNOSIS — G9389 Other specified disorders of brain: Secondary | ICD-10-CM

## 2019-08-06 DIAGNOSIS — R627 Adult failure to thrive: Secondary | ICD-10-CM

## 2019-08-06 NOTE — Telephone Encounter (Signed)
DNR was completed some time ago.  Let me follow up with Barnett Applebaum about this.  I will place referral to hospice as well.

## 2019-08-09 NOTE — Telephone Encounter (Signed)
I printed copy of DNR out of chart and mailed to patient today / lat  Please call and let her know about the hospice set up when you can please.  Mother is not doing well.  They do not use [facility] any longer as well in case you need to know this.

## 2019-08-09 NOTE — Telephone Encounter (Signed)
Per Dr. Synthia Innocent note DNR paperwork was filled out and was checking with nurse. Please advise

## 2019-08-10 NOTE — Telephone Encounter (Signed)
Courtney Frank is aware that a community message was sent to Courtney Frank with hospice.

## 2019-08-11 DIAGNOSIS — G311 Senile degeneration of brain, not elsewhere classified: Secondary | ICD-10-CM | POA: Diagnosis not present

## 2019-08-11 DIAGNOSIS — G9389 Other specified disorders of brain: Secondary | ICD-10-CM | POA: Diagnosis not present

## 2019-08-11 DIAGNOSIS — I10 Essential (primary) hypertension: Secondary | ICD-10-CM | POA: Diagnosis not present

## 2019-08-11 DIAGNOSIS — K922 Gastrointestinal hemorrhage, unspecified: Secondary | ICD-10-CM | POA: Diagnosis not present

## 2019-08-11 DIAGNOSIS — R531 Weakness: Secondary | ICD-10-CM | POA: Diagnosis not present

## 2019-08-12 DIAGNOSIS — G311 Senile degeneration of brain, not elsewhere classified: Secondary | ICD-10-CM | POA: Diagnosis not present

## 2019-08-12 DIAGNOSIS — R52 Pain, unspecified: Secondary | ICD-10-CM | POA: Diagnosis not present

## 2019-08-12 DIAGNOSIS — R531 Weakness: Secondary | ICD-10-CM | POA: Diagnosis not present

## 2019-08-12 DIAGNOSIS — I10 Essential (primary) hypertension: Secondary | ICD-10-CM | POA: Diagnosis not present

## 2019-08-12 DIAGNOSIS — R0602 Shortness of breath: Secondary | ICD-10-CM | POA: Diagnosis not present

## 2019-08-12 DIAGNOSIS — K5900 Constipation, unspecified: Secondary | ICD-10-CM | POA: Diagnosis not present

## 2019-08-12 DIAGNOSIS — G9389 Other specified disorders of brain: Secondary | ICD-10-CM | POA: Diagnosis not present

## 2019-08-12 DIAGNOSIS — G4089 Other seizures: Secondary | ICD-10-CM | POA: Diagnosis not present

## 2019-08-12 DIAGNOSIS — K922 Gastrointestinal hemorrhage, unspecified: Secondary | ICD-10-CM | POA: Diagnosis not present

## 2019-08-13 DIAGNOSIS — I10 Essential (primary) hypertension: Secondary | ICD-10-CM | POA: Diagnosis not present

## 2019-08-13 DIAGNOSIS — K922 Gastrointestinal hemorrhage, unspecified: Secondary | ICD-10-CM | POA: Diagnosis not present

## 2019-08-13 DIAGNOSIS — G9389 Other specified disorders of brain: Secondary | ICD-10-CM | POA: Diagnosis not present

## 2019-08-13 DIAGNOSIS — R531 Weakness: Secondary | ICD-10-CM | POA: Diagnosis not present

## 2019-08-13 DIAGNOSIS — G4089 Other seizures: Secondary | ICD-10-CM | POA: Diagnosis not present

## 2019-08-13 DIAGNOSIS — G311 Senile degeneration of brain, not elsewhere classified: Secondary | ICD-10-CM | POA: Diagnosis not present

## 2019-08-14 DIAGNOSIS — G9389 Other specified disorders of brain: Secondary | ICD-10-CM | POA: Diagnosis not present

## 2019-08-14 DIAGNOSIS — G311 Senile degeneration of brain, not elsewhere classified: Secondary | ICD-10-CM | POA: Diagnosis not present

## 2019-08-14 DIAGNOSIS — K922 Gastrointestinal hemorrhage, unspecified: Secondary | ICD-10-CM | POA: Diagnosis not present

## 2019-08-14 DIAGNOSIS — I10 Essential (primary) hypertension: Secondary | ICD-10-CM | POA: Diagnosis not present

## 2019-08-14 DIAGNOSIS — G4089 Other seizures: Secondary | ICD-10-CM | POA: Diagnosis not present

## 2019-08-14 DIAGNOSIS — R531 Weakness: Secondary | ICD-10-CM | POA: Diagnosis not present

## 2019-08-16 DIAGNOSIS — G4089 Other seizures: Secondary | ICD-10-CM | POA: Diagnosis not present

## 2019-08-16 DIAGNOSIS — I10 Essential (primary) hypertension: Secondary | ICD-10-CM | POA: Diagnosis not present

## 2019-08-16 DIAGNOSIS — R531 Weakness: Secondary | ICD-10-CM | POA: Diagnosis not present

## 2019-08-16 DIAGNOSIS — K922 Gastrointestinal hemorrhage, unspecified: Secondary | ICD-10-CM | POA: Diagnosis not present

## 2019-08-16 DIAGNOSIS — G9389 Other specified disorders of brain: Secondary | ICD-10-CM | POA: Diagnosis not present

## 2019-08-16 DIAGNOSIS — G311 Senile degeneration of brain, not elsewhere classified: Secondary | ICD-10-CM | POA: Diagnosis not present

## 2019-08-17 ENCOUNTER — Telehealth: Payer: Self-pay | Admitting: Family Medicine

## 2019-08-17 NOTE — Telephone Encounter (Signed)
Thank you for this information.   This patient has a brain mass and is in hospice.  I'm glad to hear that her symptoms are controlled.

## 2019-08-17 NOTE — Telephone Encounter (Signed)
On Saturday 08/14/2019 patient has had back to back seizures. The attending provider called in [place] 500 mg tid liquid and Ativan liquid. Patient also ran a high fever. Patient has not had fever or seizures since.  FYI

## 2019-08-18 DIAGNOSIS — K922 Gastrointestinal hemorrhage, unspecified: Secondary | ICD-10-CM | POA: Diagnosis not present

## 2019-08-18 DIAGNOSIS — G9389 Other specified disorders of brain: Secondary | ICD-10-CM | POA: Diagnosis not present

## 2019-08-18 DIAGNOSIS — I10 Essential (primary) hypertension: Secondary | ICD-10-CM | POA: Diagnosis not present

## 2019-08-18 DIAGNOSIS — G4089 Other seizures: Secondary | ICD-10-CM | POA: Diagnosis not present

## 2019-08-18 DIAGNOSIS — R531 Weakness: Secondary | ICD-10-CM | POA: Diagnosis not present

## 2019-08-18 DIAGNOSIS — G311 Senile degeneration of brain, not elsewhere classified: Secondary | ICD-10-CM | POA: Diagnosis not present

## 2019-08-19 ENCOUNTER — Telehealth: Payer: Self-pay | Admitting: *Deleted

## 2019-08-19 MED ORDER — FLUZONE HIGH-DOSE 0.5 ML IM SUSY: 0.5000 mL | PREFILLED_SYRINGE | Freq: Once | INTRAMUSCULAR | 0 refills | Status: AC

## 2019-08-19 NOTE — Telephone Encounter (Signed)
VM from [facility] Son is wanting patient to have the flu vaccine, pneumonia and possible the shingles vaccine. Flu vaccine can be sent to [facility] and she can administer Pt is up to date on the pneumonia vaccine, would be hesitant to do the shingles vaccine at her age. Flu vaccine sent to [facility].

## 2019-08-19 NOTE — Telephone Encounter (Signed)
Rx for flu shot.

## 2019-08-20 DIAGNOSIS — G9389 Other specified disorders of brain: Secondary | ICD-10-CM | POA: Diagnosis not present

## 2019-08-20 DIAGNOSIS — Z23 Encounter for immunization: Secondary | ICD-10-CM | POA: Diagnosis not present

## 2019-08-20 DIAGNOSIS — G4089 Other seizures: Secondary | ICD-10-CM | POA: Diagnosis not present

## 2019-08-20 DIAGNOSIS — G311 Senile degeneration of brain, not elsewhere classified: Secondary | ICD-10-CM | POA: Diagnosis not present

## 2019-08-20 DIAGNOSIS — I10 Essential (primary) hypertension: Secondary | ICD-10-CM | POA: Diagnosis not present

## 2019-08-20 DIAGNOSIS — R531 Weakness: Secondary | ICD-10-CM | POA: Diagnosis not present

## 2019-08-20 DIAGNOSIS — K922 Gastrointestinal hemorrhage, unspecified: Secondary | ICD-10-CM | POA: Diagnosis not present

## 2019-08-23 DIAGNOSIS — K922 Gastrointestinal hemorrhage, unspecified: Secondary | ICD-10-CM | POA: Diagnosis not present

## 2019-08-23 DIAGNOSIS — G9389 Other specified disorders of brain: Secondary | ICD-10-CM | POA: Diagnosis not present

## 2019-08-23 DIAGNOSIS — G311 Senile degeneration of brain, not elsewhere classified: Secondary | ICD-10-CM | POA: Diagnosis not present

## 2019-08-23 DIAGNOSIS — R531 Weakness: Secondary | ICD-10-CM | POA: Diagnosis not present

## 2019-08-23 DIAGNOSIS — G4089 Other seizures: Secondary | ICD-10-CM | POA: Diagnosis not present

## 2019-08-23 DIAGNOSIS — I10 Essential (primary) hypertension: Secondary | ICD-10-CM | POA: Diagnosis not present

## 2019-08-24 DIAGNOSIS — G311 Senile degeneration of brain, not elsewhere classified: Secondary | ICD-10-CM | POA: Diagnosis not present

## 2019-08-24 DIAGNOSIS — G4089 Other seizures: Secondary | ICD-10-CM | POA: Diagnosis not present

## 2019-08-24 DIAGNOSIS — R531 Weakness: Secondary | ICD-10-CM | POA: Diagnosis not present

## 2019-08-24 DIAGNOSIS — K922 Gastrointestinal hemorrhage, unspecified: Secondary | ICD-10-CM | POA: Diagnosis not present

## 2019-08-24 DIAGNOSIS — G9389 Other specified disorders of brain: Secondary | ICD-10-CM | POA: Diagnosis not present

## 2019-08-24 DIAGNOSIS — I10 Essential (primary) hypertension: Secondary | ICD-10-CM | POA: Diagnosis not present

## 2019-08-25 DIAGNOSIS — G311 Senile degeneration of brain, not elsewhere classified: Secondary | ICD-10-CM | POA: Diagnosis not present

## 2019-08-25 DIAGNOSIS — K922 Gastrointestinal hemorrhage, unspecified: Secondary | ICD-10-CM | POA: Diagnosis not present

## 2019-08-25 DIAGNOSIS — G4089 Other seizures: Secondary | ICD-10-CM | POA: Diagnosis not present

## 2019-08-25 DIAGNOSIS — I10 Essential (primary) hypertension: Secondary | ICD-10-CM | POA: Diagnosis not present

## 2019-08-25 DIAGNOSIS — R531 Weakness: Secondary | ICD-10-CM | POA: Diagnosis not present

## 2019-08-25 DIAGNOSIS — G9389 Other specified disorders of brain: Secondary | ICD-10-CM | POA: Diagnosis not present

## 2019-08-26 ENCOUNTER — Ambulatory Visit: Payer: Medicare Other | Admitting: Family Medicine

## 2019-09-12 DEATH — deceased
